# Patient Record
Sex: Male | Born: 1985 | Race: Black or African American | Hispanic: No | Marital: Married | Smoking: Former smoker
Health system: Southern US, Community
[De-identification: ages and names within clinical notes are randomized; demographics above are authoritative.]

## PROBLEM LIST (undated history)

## (undated) ENCOUNTER — Emergency Department (HOSPITAL_BASED_OUTPATIENT_CLINIC_OR_DEPARTMENT_OTHER): Payer: BLUE CROSS/BLUE SHIELD | Source: Home / Self Care

## (undated) DIAGNOSIS — J329 Chronic sinusitis, unspecified: Secondary | ICD-10-CM

## (undated) DIAGNOSIS — K219 Gastro-esophageal reflux disease without esophagitis: Secondary | ICD-10-CM

## (undated) HISTORY — DX: Gastro-esophageal reflux disease without esophagitis: K21.9

---
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
## 2007-09-10 ENCOUNTER — Emergency Department (HOSPITAL_COMMUNITY): Admission: EM | Admit: 2007-09-10 | Discharge: 2007-09-10 | Payer: Self-pay | Admitting: Emergency Medicine

## 2008-02-13 ENCOUNTER — Emergency Department (HOSPITAL_COMMUNITY): Admission: EM | Admit: 2008-02-13 | Discharge: 2008-02-13 | Payer: Self-pay | Admitting: Emergency Medicine

## 2011-05-05 LAB — URINALYSIS, ROUTINE W REFLEX MICROSCOPIC
Bilirubin Urine: NEGATIVE
Glucose, UA: NEGATIVE
Hgb urine dipstick: NEGATIVE
Ketones, ur: NEGATIVE
Nitrite: NEGATIVE
Protein, ur: NEGATIVE
Specific Gravity, Urine: 1.009
Urobilinogen, UA: 0.2
pH: 8

## 2011-08-16 ENCOUNTER — Encounter: Payer: Self-pay | Admitting: *Deleted

## 2011-08-16 ENCOUNTER — Emergency Department (HOSPITAL_COMMUNITY)
Admission: EM | Admit: 2011-08-16 | Discharge: 2011-08-16 | Disposition: A | Discharge status: Home / Self Care | Payer: Self-pay | Attending: Emergency Medicine | Admitting: Emergency Medicine

## 2011-08-16 DIAGNOSIS — N5089 Other specified disorders of the male genital organs: Secondary | ICD-10-CM | POA: Insufficient documentation

## 2011-08-16 DIAGNOSIS — F17200 Nicotine dependence, unspecified, uncomplicated: Secondary | ICD-10-CM | POA: Insufficient documentation

## 2011-08-16 NOTE — ED Notes (Signed)
Pt tying dog at fence and exposed to poison ivy, now has it on his scrotum and thighs

## 2011-08-16 NOTE — ED Provider Notes (Signed)
History     CSN: [id number]  Arrival date & time 08/16/11  6578   First MD Initiated Contact with Patient 08/16/11 [phone]      Chief Complaint  Patient presents with  . Poison Ivy    (Consider location/radiation/quality/duration/timing/severity/associated sxs/prior treatment) HPI Comments: Patient comes in today with swelling of his scrotum.  Swelling has been present for 2 days.  Swelling is bilateral.   No pain associated with the swelling.  He reports that he had this in the past and that his symptoms today are the same as it was then.  Reviewing the past chart shows that he had an ultrasound of his scrotum at that time that showed a 1.3cm cyst in the head of the epididymis.  He denies any fever/chills.  Denies any pain or redness.  Denies any penile discharge.  Reports that he is not having any other symptoms.    The history is provided by the patient.    History reviewed. No pertinent past medical history.  History reviewed. No pertinent past surgical history.  No family history on file.  History  Substance Use Topics  . Smoking status: Current Everyday Smoker  . Smokeless tobacco: Not on file  . Alcohol Use: Yes     occ      Review of Systems  Constitutional: Negative for fever and chills.  Genitourinary: Positive for scrotal swelling. Negative for dysuria, urgency, hematuria, decreased urine volume, discharge, penile swelling, difficulty urinating, genital sores, penile pain and testicular pain.    Allergies  Review of patient's allergies indicates no known allergies.  Home Medications  No current outpatient prescriptions on file.  BP 134/70  Pulse 72  Temp(Src) 98 F (36.7 C) (Oral)  Resp 18  SpO2 100%  Physical Exam  Nursing note and vitals reviewed. Constitutional: He is oriented to person, place, and time. He appears well-developed and well-nourished.  HENT:  Head: Normocephalic and atraumatic.  Neck: Normal range of motion. Neck supple.    Cardiovascular: Normal rate, regular rhythm and normal heart sounds.   Pulmonary/Chest: Effort normal and breath sounds normal. No respiratory distress.  Genitourinary: Right testis shows swelling. Right testis shows no mass and no tenderness. Cremasteric reflex is not absent on the right side. Left testis shows swelling. Left testis shows no mass and no tenderness. Cremasteric reflex is not absent on the left side. Circumcised. No penile erythema or penile tenderness. No discharge found.       Swelling of the scrotum bilaterally.  Swelling is symmetric. No pain during examination of the scrotum and testicles.   No skin lesions or rash present.  Neurological: He is alert and oriented to person, place, and time.  Skin: Skin is warm and dry. No rash noted. No erythema.  Psychiatric: He has a normal mood and affect.    ED Course  Procedures (including critical care time)  Labs Reviewed - No data to display No results found.   1. Scrotum swelling     Discussed patient with Dr. Fonnie Jarvis.  Dr. Fonnie Jarvis does not feel that imaging is needed at this time because the patient is not having any pain and the swelling is symmetric.  MDM  No pain associated with swelling and swelling has been present for 2-3 days.  Normal GU exam aside from the swelling.  Therefore, do not feel that testicular torsion is likely and do not feel that an ultrasound is needed at this time.  Patient instructed to follow up with Urology outpatient.  Pascal Lux [facility] 08/16/11 1652

## 2011-08-20 NOTE — ED Provider Notes (Signed)
Medical screening examination/treatment/procedure(s) were performed by non-physician practitioner and as supervising physician I was immediately available for consultation/collaboration.  Hurman Horn, MD 08/20/11 2249

## 2012-03-07 ENCOUNTER — Emergency Department (HOSPITAL_COMMUNITY)
Admission: EM | Admit: 2012-03-07 | Discharge: 2012-03-07 | Disposition: A | Discharge status: Home / Self Care | Payer: Self-pay | Attending: Emergency Medicine | Admitting: Emergency Medicine

## 2012-03-07 ENCOUNTER — Encounter (HOSPITAL_COMMUNITY): Payer: Self-pay | Admitting: *Deleted

## 2012-03-07 DIAGNOSIS — F17200 Nicotine dependence, unspecified, uncomplicated: Secondary | ICD-10-CM | POA: Insufficient documentation

## 2012-03-07 DIAGNOSIS — H9209 Otalgia, unspecified ear: Secondary | ICD-10-CM | POA: Insufficient documentation

## 2012-03-07 DIAGNOSIS — H9203 Otalgia, bilateral: Secondary | ICD-10-CM

## 2012-03-07 MED ORDER — ANTIPYRINE-BENZOCAINE 5.4-1.4 % OT SOLN
3.0000 [drp] | Freq: Once | OTIC | Status: AC
  Administered 2012-03-07: 4 [drp] via OTIC
  Filled 2012-03-07: qty 10

## 2012-03-07 NOTE — ED Provider Notes (Signed)
History     CSN: [id number]  Arrival date & time 03/07/12  1115   First MD Initiated Contact with Patient 03/07/12 1258      Chief Complaint  Patient presents with  . Otalgia    (Consider location/radiation/quality/duration/timing/severity/associated sxs/prior treatment) HPI  26 year old male presents complaining of ear pain.  Pt report he was getting over a cold a week ago however he continues to have pain and fluid sensation to his ears R>L.  Pain is waxing and waning worse at night, with coughing or swallowing.  Denies fever, chills, ringing in ears, jaw pain, or trouble swallowing.  Denies swimming in pool or pain when laying on affected ear.  Denies discharge, or rash.  History reviewed. No pertinent past medical history.  History reviewed. No pertinent past surgical history.  No family history on file.  History  Substance Use Topics  . Smoking status: Current Everyday Smoker  . Smokeless tobacco: Not on file  . Alcohol Use: Yes     occ      Review of Systems  Constitutional: Negative for fever.  HENT: Positive for ear pain. Negative for hearing loss, tinnitus and ear discharge.   Skin: Negative for rash.  Neurological: Negative for numbness.  All other systems reviewed and are negative.    Allergies  Review of patient's allergies indicates no known allergies.  Home Medications  No current outpatient prescriptions on file.  BP 122/71  Pulse 96  Temp 98.7 F (37.1 C) (Oral)  Resp 18  SpO2 100%  Physical Exam  Nursing note and vitals reviewed. Constitutional: He appears well-developed and well-nourished. No distress.  HENT:  Head: Normocephalic and atraumatic.  Right Ear: Hearing, tympanic membrane and external ear normal. No drainage, swelling or tenderness. No foreign bodies. Tympanic membrane is not injected, not perforated and not retracted. No decreased hearing is noted.  Left Ear: Hearing, tympanic membrane and external ear normal. No drainage,  swelling or tenderness. No foreign bodies. Tympanic membrane is not injected, not perforated and not retracted. No decreased hearing is noted.  Mouth/Throat: Oropharynx is clear and moist. No oropharyngeal exudate.  Eyes: Conjunctivae are normal.  Neck: Normal range of motion. Neck supple.  Lymphadenopathy:    He has no cervical adenopathy.  Neurological: He is alert.  Skin: Skin is warm. No rash noted.  Psychiatric: He has a normal mood and affect.    ED Course  Procedures (including critical care time)  Labs Reviewed - No data to display No results found.   No diagnosis found.  1. otalgia  MDM  Ear pain, no obvious signs of infection, is afebrile.  Will give auralgan ear drops for sxs relief.          Fayrene Helper, PA-C 03/07/12 1341

## 2012-03-07 NOTE — ED Notes (Signed)
Pt reports recent cold, that has progressed to an earache, sts "there is fluid behind my eardrum." Pressure, worse when coughing or swallowing.

## 2012-03-07 NOTE — ED Provider Notes (Signed)
Medical screening examination/treatment/procedure(s) were performed by non-physician practitioner and as supervising physician I was immediately available for consultation/collaboration.   Nat Christen, MD 03/07/12 [phone]

## 2013-11-28 ENCOUNTER — Emergency Department (HOSPITAL_COMMUNITY)
Admission: EM | Admit: 2013-11-28 | Discharge: 2013-11-28 | Disposition: A | Discharge status: Home / Self Care | Payer: Self-pay | Attending: Emergency Medicine | Admitting: Emergency Medicine

## 2013-11-28 ENCOUNTER — Encounter (HOSPITAL_COMMUNITY): Payer: Self-pay | Admitting: Emergency Medicine

## 2013-11-28 DIAGNOSIS — R1032 Left lower quadrant pain: Secondary | ICD-10-CM

## 2013-11-28 DIAGNOSIS — R109 Unspecified abdominal pain: Secondary | ICD-10-CM | POA: Insufficient documentation

## 2013-11-28 DIAGNOSIS — F17200 Nicotine dependence, unspecified, uncomplicated: Secondary | ICD-10-CM | POA: Insufficient documentation

## 2013-11-28 LAB — URINALYSIS, ROUTINE W REFLEX MICROSCOPIC
BILIRUBIN URINE: NEGATIVE
GLUCOSE, UA: NEGATIVE mg/dL
Hgb urine dipstick: NEGATIVE
Ketones, ur: NEGATIVE mg/dL
Leukocytes, UA: NEGATIVE
Nitrite: NEGATIVE
Protein, ur: NEGATIVE mg/dL
SPECIFIC GRAVITY, URINE: 1.018 (ref 1.005–1.030)
UROBILINOGEN UA: 1 mg/dL (ref 0.0–1.0)
pH: 6 (ref 5.0–8.0)

## 2013-11-28 MED ORDER — HYDROCODONE-ACETAMINOPHEN 5-325 MG PO TABS: 2.0000 | ORAL_TABLET | ORAL | Status: DC | PRN

## 2013-11-28 NOTE — Discharge Instructions (Signed)
Hernia A hernia occurs when an internal organ pushes out through a weak spot in the abdominal wall. Hernias most commonly occur in the groin and around the navel. Hernias often can be pushed back into place (reduced). Most hernias tend to get worse over time. Some abdominal hernias can get stuck in the opening (irreducible or incarcerated hernia) and cannot be reduced. An irreducible abdominal hernia which is tightly squeezed into the opening is at risk for impaired blood supply (strangulated hernia). A strangulated hernia is a medical emergency. Because of the risk for an irreducible or strangulated hernia, surgery may be recommended to repair a hernia. CAUSES   Heavy lifting.  Prolonged coughing.  Straining to have a bowel movement.  A cut (incision) made during an abdominal surgery. HOME CARE INSTRUCTIONS   Bed rest is not required. You may continue your normal activities.  Avoid lifting more than 10 pounds (4.5 kg) or straining.  Cough gently. If you are a smoker it is best to stop. Even the best hernia repair can break down with the continual strain of coughing. Even if you do not have your hernia repaired, a cough will continue to aggravate the problem.  Do not wear anything tight over your hernia. Do not try to keep it in with an outside bandage or truss. These can damage abdominal contents if they are trapped within the hernia sac.  Eat a normal diet.  Avoid constipation. Straining over long periods of time will increase hernia size and encourage breakdown of repairs. If you cannot do this with diet alone, stool softeners may be used. SEEK IMMEDIATE MEDICAL CARE IF:   You have a fever.  You develop increasing abdominal pain.  You feel nauseous or vomit.  Your hernia is stuck outside the abdomen, looks discolored, feels hard, or is tender.  You have any changes in your bowel habits or in the hernia that are unusual for you.  You have increased pain or swelling around the  hernia.  You cannot push the hernia back in place by applying gentle pressure while lying down. MAKE SURE YOU:   Understand these instructions.  Will watch your condition.  Will get help right away if you are not doing well or get worse. Document Released: 08/01/2005 Document Revised: 10/24/2011 Document Reviewed: 03/20/2008 Exit[employer] Patient Information 2014 [employer].  

## 2013-11-28 NOTE — ED Provider Notes (Signed)
CSN: [id number]     Arrival date & time 11/28/13  1919 History   First MD Initiated Contact with Patient 11/28/13 1946     Chief Complaint  Patient presents with  . Testicle Pain      HPI Pt reports testicle pain that started 2 days ago that comes and goes. Pt states pain is an achy throbbing pain in quality that radiates to L groin. Pt denies any n/v, fever. Pt alert and oriented and ambulatory to triage area.  History reviewed. No pertinent past medical history. History reviewed. No pertinent past surgical history. History reviewed. No pertinent family history. History  Substance Use Topics  . Smoking status: Current Every Day Smoker    Types: Cigarettes  . Smokeless tobacco: Not on file  . Alcohol Use: Yes     Comment: occ    Review of Systems   All other systems reviewed and are negative   Allergies  Review of patient's allergies indicates no known allergies.  Home Medications   Prior to Admission medications   Medication Sig Start Date End Date Taking? Authorizing Provider  diphenhydrAMINE (BENADRYL) 25 mg capsule Take 25 mg by mouth every 6 (six) hours as needed for allergies.   Yes Historical Provider, MD   BP 122/63  Pulse 68  Temp(Src) 97.9 F (36.6 C) (Oral)  Resp 16  Ht 5\' 7"  (1.702 m)  Wt 243 lb 9.6 oz (110.496 kg)  BMI 38.14 kg/m2  SpO2 99% Physical Exam  Nursing note and vitals reviewed. Constitutional: He is oriented to person, place, and time. He appears well-developed and well-nourished. No distress.  HENT:  Head: Normocephalic and atraumatic.  Eyes: Pupils are equal, round, and reactive to light.  Neck: Normal range of motion.  Cardiovascular: Normal rate and intact distal pulses.   Pulmonary/Chest: No respiratory distress.  Abdominal: Normal appearance. He exhibits no distension. Hernia confirmed negative in the right inguinal area and confirmed negative in the left inguinal area.  Genitourinary: Testes normal and penis normal. Cremasteric  reflex is present. Right testis shows no mass, no swelling and no tenderness. Right testis is descended. Left testis shows no mass, no swelling and no tenderness. Left testis is descended.  Musculoskeletal: Normal range of motion.  Lymphadenopathy:       Right: No inguinal adenopathy present.       Left: No inguinal adenopathy present.  Neurological: He is alert and oriented to person, place, and time. No cranial nerve deficit.  Skin: Skin is warm and dry. No rash noted.  Psychiatric: He has a normal mood and affect. His behavior is normal.    ED Course  Procedures (including critical care time) Labs Review Labs Reviewed  URINALYSIS, ROUTINE W REFLEX MICROSCOPIC    Imaging Review No results found.    MDM   Final diagnoses:  Left inguinal pain        Nelia Shiobert L Rashada Klontz, MD 12/05/13 [phone]

## 2013-11-28 NOTE — ED Notes (Signed)
Pt reports testicle pain that started 2 days ago that comes and goes. Pt states pain is an achy throbbing pain in quality that radiates to L groin. Pt denies any n/v, fever. Pt alert and oriented and ambulatory to triage area.

## 2014-02-13 ENCOUNTER — Encounter (HOSPITAL_COMMUNITY): Payer: Self-pay | Admitting: Emergency Medicine

## 2014-02-13 ENCOUNTER — Emergency Department (HOSPITAL_COMMUNITY)
Admission: EM | Admit: 2014-02-13 | Discharge: 2014-02-13 | Disposition: A | Discharge status: Home / Self Care | Payer: Self-pay | Attending: Emergency Medicine | Admitting: Emergency Medicine

## 2014-02-13 DIAGNOSIS — F17200 Nicotine dependence, unspecified, uncomplicated: Secondary | ICD-10-CM | POA: Insufficient documentation

## 2014-02-13 DIAGNOSIS — H5789 Other specified disorders of eye and adnexa: Secondary | ICD-10-CM | POA: Insufficient documentation

## 2014-02-13 DIAGNOSIS — Z8709 Personal history of other diseases of the respiratory system: Secondary | ICD-10-CM | POA: Insufficient documentation

## 2014-02-13 DIAGNOSIS — H539 Unspecified visual disturbance: Secondary | ICD-10-CM | POA: Insufficient documentation

## 2014-02-13 DIAGNOSIS — H5712 Ocular pain, left eye: Secondary | ICD-10-CM

## 2014-02-13 DIAGNOSIS — Z79899 Other long term (current) drug therapy: Secondary | ICD-10-CM | POA: Insufficient documentation

## 2014-02-13 DIAGNOSIS — H5710 Ocular pain, unspecified eye: Secondary | ICD-10-CM | POA: Insufficient documentation

## 2014-02-13 HISTORY — DX: Chronic sinusitis, unspecified: J32.9

## 2014-02-13 MED ORDER — IBUPROFEN 800 MG PO TABS: 800.0000 mg | ORAL_TABLET | Freq: Three times a day (TID) | ORAL | Status: DC

## 2014-02-13 NOTE — Progress Notes (Signed)
[facility] CL provided pt with a list of primary care resources. Patient stated that he can get insurance through job but has not filled out paperwork. CL encouraged patient to sign up with insurance through job.

## 2014-02-13 NOTE — ED Provider Notes (Signed)
CSN: [id number]     Arrival date & time 02/13/14  1531 History  This chart was scribed for Santiago GladHeather Laurelyn Terrero PA-C working with Harrold DonathNathan R. Rubin PayorPickering, MD by Ashley JacobsBrittany Andrews, ED scribe. This patient was seen in room WTR7/WTR7 and the patient's care was started at 4:21 PM.  First MD Initiated Contact with Patient 02/13/14 1616     Chief Complaint  Patient presents with  . Eye Pain    pain in l/eye x 3 days. Denies trauma     (Consider location/radiation/quality/duration/timing/severity/associated sxs/prior Treatment) Patient is a 28 y.o. male presenting with eye pain. The history is provided by the patient and medical records. No language interpreter was used.  Eye Pain   HPI Comments: Connor White is a 28 y.o. 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 He describes the pain as a pressure.  He reports that his vision has seemed slightly blurred and that he has also noticed slight redness of the eye.  He has not taken anything for his symptoms prior to arrival.  He does not wear contact lenses.  He denies fever or chills.   Past Medical History  Diagnosis Date  . Sinusitis    History reviewed. No pertinent past surgical history. Family History  Problem Relation Age of Onset  . Hypertension Mother   . Diabetes Other    History  Substance Use Topics  . Smoking status: Current Every Day Smoker    Types: Cigarettes  . Smokeless tobacco: Not on file  . Alcohol Use: Yes     Comment: occ    Review of Systems  Eyes: Positive for pain, discharge, redness and visual disturbance.  All other systems reviewed and are negative.     Allergies  Review of patient's allergies indicates no known allergies.  Home Medications   Prior to Admission medications   Medication Sig Start Date End Date Taking? Authorizing Provider  diphenhydrAMINE  (BENADRYL) 25 mg capsule Take 25 mg by mouth every 6 (six) hours as needed for allergies.    Historical Provider, MD  HYDROcodone-acetaminophen (NORCO/VICODIN) 5-325 MG per tablet Take 2 tablets by mouth every 4 (four) hours as needed. 11/28/13   Nelia Shiobert L Beaton, MD   BP 118/70  Pulse 88  Temp(Src) 98.4 F (36.9 C) (Oral)  Resp 16  SpO2 98% Physical Exam  Nursing note and vitals reviewed. Constitutional: He appears well-developed and well-nourished.  HENT:  Head: Normocephalic and atraumatic.  Eyes: Conjunctivae, EOM and lids are normal. Pupils are equal, round, and reactive to light. Lids are everted and swept, no foreign bodies found. Left eye exhibits no chemosis, no discharge, no exudate and no hordeolum. No foreign body present in the left eye. Right conjunctiva is not injected. Left conjunctiva is not injected. No scleral icterus.  Slit lamp exam:      The left eye shows no corneal abrasion, no corneal flare, no corneal ulcer, no foreign body and no fluorescein uptake.  No periorbital erythema or edema.  IOP of the left eye is 21   Visual Acuity  Right Eye Distance: 20/20 Left Eye Distance: 20/20 Bilateral Distance: 20/20  Right Eye Near:   Left Eye Near:    Bilateral Near:     Neck: Normal range of motion. Neck supple.  Cardiovascular: Normal rate, regular rhythm and normal heart sounds.   Pulmonary/Chest: Effort normal and  breath sounds normal.  Neurological: He is alert.  Skin: Skin is warm and dry. No erythema.  Psychiatric: He has a normal mood and affect.    ED Course  Procedures (including critical care time) DIAGNOSTIC STUDIES: Oxygen Saturation is 98% on room air, normal by my interpretation.    COORDINATION OF CARE:  4:26 PM Discussed course of care with pt . Pt understands and agrees.   Labs Review Labs Reviewed - No data to display  Imaging Review No results found.   EKG Interpretation None      MDM   Final diagnoses:  None   Patient  presenting with left eye pain.  Normal visual acuity.  No fluorescein uptake on exam.  IOP of left eye is 21.  No signs of infection on exam.  Therefore, feel that the patient is stable for discharge.  Patient given referral to Ophthalmology if symptoms persist.  Return precautions given.    Santiago GladHeather Jermisha Hoffart, PA-C 02/16/14 [phone]

## 2014-02-13 NOTE — ED Notes (Signed)
L/eye slightly red, Pt reports pain and irritation in l/eye x 3 days

## 2014-02-17 NOTE — ED Provider Notes (Signed)
Medical screening examination/treatment/procedure(s) were performed by non-physician practitioner and as supervising physician I was immediately available for consultation/collaboration.   EKG Interpretation None       Juliet RudeNathan R. Rubin PayorPickering, MD 02/17/14 1257

## 2015-05-07 ENCOUNTER — Emergency Department (HOSPITAL_COMMUNITY)
Admission: EM | Admit: 2015-05-07 | Discharge: 2015-05-07 | Disposition: A | Discharge status: Home / Self Care | Payer: Self-pay | Attending: Physician Assistant | Admitting: Physician Assistant

## 2015-05-07 ENCOUNTER — Emergency Department (HOSPITAL_COMMUNITY): Payer: Self-pay

## 2015-05-07 ENCOUNTER — Encounter (HOSPITAL_COMMUNITY): Payer: Self-pay

## 2015-05-07 DIAGNOSIS — R0602 Shortness of breath: Secondary | ICD-10-CM | POA: Insufficient documentation

## 2015-05-07 DIAGNOSIS — R000 Tachycardia, unspecified: Secondary | ICD-10-CM | POA: Insufficient documentation

## 2015-05-07 DIAGNOSIS — F1410 Cocaine abuse, uncomplicated: Secondary | ICD-10-CM | POA: Insufficient documentation

## 2015-05-07 DIAGNOSIS — R002 Palpitations: Secondary | ICD-10-CM | POA: Insufficient documentation

## 2015-05-07 DIAGNOSIS — Z720 Tobacco use: Secondary | ICD-10-CM | POA: Insufficient documentation

## 2015-05-07 LAB — BASIC METABOLIC PANEL
Anion gap: 11 (ref 5–15)
CALCIUM: 9.1 mg/dL (ref 8.9–10.3)
CO2: 22 mmol/L (ref 22–32)
Chloride: 108 mmol/L (ref 101–111)
Creatinine, Ser: 0.87 mg/dL (ref 0.61–1.24)
GFR calc Af Amer: >60 mL/min (ref >60)
GFR calc non Af Amer: >60 mL/min (ref >60)
GLUCOSE: 104 mg/dL — AB (ref 65–99)
POTASSIUM: 3.3 mmol/L — AB (ref 3.5–5.1)
Sodium: 141 mmol/L (ref 135–145)

## 2015-05-07 LAB — RAPID URINE DRUG SCREEN, HOSP PERFORMED
Amphetamines: NOT DETECTED
Barbiturates: NOT DETECTED
Benzodiazepines: NOT DETECTED
Cocaine: POSITIVE — AB
Opiates: NOT DETECTED
Tetrahydrocannabinol: NOT DETECTED

## 2015-05-07 LAB — CBC WITH DIFFERENTIAL/PLATELET
Basophils Absolute: 0 10*3/uL (ref 0.0–0.1)
Basophils Relative: 0 %
Eosinophils Absolute: 0 10*3/uL (ref 0.0–0.7)
Eosinophils Relative: 0 %
HCT: 42.6 % (ref 39.0–52.0)
Hemoglobin: 15.2 g/dL (ref 13.0–17.0)
LYMPHS ABS: 1.4 10*3/uL (ref 0.7–4.0)
LYMPHS PCT: 18 %
MCH: 32.1 pg (ref 26.0–34.0)
MCHC: 35.7 g/dL (ref 30.0–36.0)
MCV: 90.1 fL (ref 78.0–100.0)
MONO ABS: 0.9 10*3/uL (ref 0.1–1.0)
MONOS PCT: 12 %
Neutro Abs: 5.5 10*3/uL (ref 1.7–7.7)
Neutrophils Relative %: 70 %
Platelets: 273 10*3/uL (ref 150–400)
RBC: 4.73 MIL/uL (ref 4.22–5.81)
RDW: 13.4 % (ref 11.5–15.5)
WBC: 7.8 10*3/uL (ref 4.0–10.5)

## 2015-05-07 LAB — I-STAT TROPONIN, ED: Troponin i, poc: 0.03 ng/mL (ref 0.00–0.08)

## 2015-05-07 LAB — MAGNESIUM: Magnesium: 1.7 mg/dL (ref 1.7–2.4)

## 2015-05-07 MED ORDER — LORAZEPAM 2 MG/ML IJ SOLN
2.0000 mg | Freq: Once | INTRAMUSCULAR | Status: AC
  Administered 2015-05-07: 2 mg via INTRAVENOUS
  Filled 2015-05-07: qty 1

## 2015-05-07 MED ORDER — SODIUM CHLORIDE 0.9 % IV BOLUS (SEPSIS)
1000.0000 mL | Freq: Once | INTRAVENOUS | Status: AC
  Administered 2015-05-07: 1000 mL via INTRAVENOUS

## 2015-05-07 NOTE — ED Provider Notes (Signed)
CSN: [id number]     Arrival date & time 05/07/15  4098 History   First MD Initiated Contact with Patient 05/07/15 [phone]     Chief Complaint  Patient presents with  . Irregular Heart Beat     (Consider location/radiation/quality/duration/timing/severity/associated sxs/prior Treatment) Patient is a 29 y.o. male presenting with palpitations.  Palpitations Palpitations quality:  Fast Onset quality:  Sudden Duration:  2 hours Timing:  Constant Progression:  Improving Chronicity:  New Context: illicit drugs   Relieved by:  None tried Worsened by:  Nothing Ineffective treatments:  None tried Associated symptoms: shortness of breath   Associated symptoms: no chest pain, no nausea, no near-syncope and no vomiting   Risk factors: no heart disease     Past Medical History  Diagnosis Date  . Sinusitis    History reviewed. No pertinent past surgical history. Family History  Problem Relation Age of Onset  . Hypertension Mother   . Diabetes Other    Social History  Substance Use Topics  . Smoking status: Current Every Day Smoker    Types: Cigarettes  . Smokeless tobacco: None  . Alcohol Use: Yes     Comment: occ    Review of Systems  Constitutional: Negative for fever and chills.  HENT: Negative for congestion and sore throat.   Eyes: Negative for visual disturbance.  Respiratory: Positive for shortness of breath. Negative for wheezing.   Cardiovascular: Positive for palpitations. Negative for chest pain and near-syncope.  Gastrointestinal: Negative for nausea, vomiting, abdominal pain, diarrhea and constipation.  Genitourinary: Negative for dysuria and difficulty urinating.  Musculoskeletal: Negative for myalgias and arthralgias.  Skin: Negative for wound.  Neurological: Negative for syncope and headaches.  Psychiatric/Behavioral: Negative for behavioral problems.  All other systems reviewed and are negative.     Allergies  Review of patient's allergies indicates no  known allergies.  Home Medications   Prior to Admission medications   Medication Sig Start Date End Date Taking? Authorizing Provider  ibuprofen (ADVIL,MOTRIN) 200 MG tablet Take 400 mg by mouth every 6 (six) hours as needed for mild pain.   Yes Historical Provider, MD  diphenhydrAMINE (BENADRYL) 25 mg capsule Take 25 mg by mouth every 6 (six) hours as needed for allergies.    Historical Provider, MD  HYDROcodone-acetaminophen (NORCO/VICODIN) 5-325 MG per tablet Take 2 tablets by mouth every 4 (four) hours as needed. Patient not taking: Reported on 05/07/2015 11/28/13   Nelva Nay, MD  ibuprofen (ADVIL,MOTRIN) 800 MG tablet Take 1 tablet (800 mg total) by mouth 3 (three) times daily. Patient not taking: Reported on 05/07/2015 02/13/14   Heather Laisure, PA-C   BP 141/84 mmHg  Pulse 111  Temp(Src) 98.3 F (36.8 C) (Oral)  Resp 16  Ht  (1.702 m)  Wt 240 lb (108.863 kg)  BMI 37.58 kg/m2  SpO2 99% Physical Exam  Constitutional: He is oriented to person, place, and time. He appears well-developed and well-nourished.  HENT:  Head: Normocephalic and atraumatic.  Eyes: EOM are normal.  Neck: Normal range of motion.  Cardiovascular: Regular rhythm and normal heart sounds.  Tachycardia present.   No murmur heard. Pulmonary/Chest: Effort normal and breath sounds normal. No respiratory distress. He has no wheezes. He has no rales.  Abdominal: Soft. There is no tenderness.  Musculoskeletal: He exhibits no edema.  Neurological: He is alert and oriented to person, place, and time.  Skin: No rash noted. He is not diaphoretic.    ED Course  Procedures (including critical care  time) Labs Review Labs Reviewed  URINE RAPID DRUG SCREEN, HOSP PERFORMED - Abnormal; Notable for the following:    Cocaine POSITIVE (*)    All other components within normal limits  BASIC METABOLIC PANEL - Abnormal; Notable for the following:    Potassium 3.3 (*)    Glucose, Bld 104 (*)    BUN <5 (*)    All  other components within normal limits  CBC WITH DIFFERENTIAL/PLATELET  MAGNESIUM  I-STAT TROPOININ, ED    Imaging Review Dg Chest 2 View  05/07/2015   CLINICAL DATA:  Shortness of breath, tachycardia.  EXAM: CHEST  2 VIEW  COMPARISON:  None.  FINDINGS: The heart size and mediastinal contours are within normal limits. Both lungs are clear. No pneumothorax or pleural effusion is noted. The visualized skeletal structures are unremarkable.  IMPRESSION: No active cardiopulmonary disease.   Electronically Signed   By: Lupita Raider, M.D.   On: 05/07/2015 10:10   I have personally reviewed and evaluated these images and lab results as part of my medical decision-making.   EKG Interpretation   Date/Time:  Thursday May 07 2015 08:41:26 EDT Ventricular Rate:  114 PR Interval:  189 QRS Duration: 104 QT Interval:  324 QTC Calculation: 446 R Axis:   83 Text Interpretation:  Sinus tachycardia Probable inferior infarct, old  Borderline ST elevation, anterior leads Lateral leads are also involved  Tachycardic. q waves inferior  ST minimal elevation anterior, likely BER  Confirmed by Corlis Leak, COURTNEY ([id number]) on 05/07/2015 8:52:03 AM      MDM   Final diagnoses:  Cocaine abuse  Palpitations     Patient is a 29 year old male that presents with palpitations and tachycardia. Patient states he used large amount of cocaine and drank 10 beers last night and this morning when he was getting ready for school began feeling short of breath and palpitations. Patient denies chest pain at this time. Arrival to the ED the patient is tachycardic to 120 in sinus rhythm. We will get screening labs, IV fluids, Ativan and reassess.  Following Ativan IV fluids patient states he feels much better at this time. Patient's heart rate is now 90. Patient's labs are reassuring. Patient given resources for addiction recovery.  Beverely Risen, MD 05/07/15 1106  Courteney Randall An, MD 05/07/15 6045

## 2015-05-07 NOTE — ED Notes (Signed)
Resident Lafayette Dragon, MD at the bedside.

## 2015-05-07 NOTE — ED Notes (Signed)
Pt reports drinking aprox. 10 beers last night and using cocaine around 0200 this morning.  Pt woke up to get ready for school and reports that he felt as if his heart was racing.  Pt denies dizziness, chest pain or SOB.  Pt sinus tach on monitor.

## 2015-05-07 NOTE — Discharge Instructions (Signed)
Stimulant Use Disorder-Cocaine °Cocaine is one of a group of powerful drugs called stimulants. Cocaine has medical uses for stopping nosebleeds and for pain control before minor nose or dental surgery. However, cocaine is misused because of the effects that it produces. These effects include:  °· A feeling of extreme pleasure. °· Alertness. °· High energy. °Common street names for cocaine include coke, crack, blow, snow, and nose candy. Cocaine is snorted, dissolved in water and injected, or smoked.  °Stimulants are addictive because they activate regions of the brain that produce both the pleasurable sensation of "reward" and psychological dependence. Together, these actions account for loss of control and the rapid development of drug dependence. This means you become ill without the drug (withdrawal) and need to keep using it to function.  °Stimulant use disorder is use of stimulants that disrupts your daily life. It disrupts relationships with family and friends and how you do your job. Cocaine increases your blood pressure and heart rate. It can cause a heart attack or stroke. Cocaine can also cause death from irregular heart rate or seizures. °SYMPTOMS °Symptoms of stimulant use disorder with cocaine include: °· Use of cocaine in larger amounts or over a longer period of time than intended. °· Unsuccessful attempts to cut down or control cocaine use. °· A lot of time spent obtaining, using, or recovering from the effects of cocaine. °· A strong desire or urge to use cocaine (craving). °· Continued use of cocaine in spite of major problems at work, school, or home because of use. °· Continued use of cocaine in spite of relationship problems because of use. °· Giving up or cutting down on important life activities because of cocaine use. °· Use of cocaine over and over in situations when it is physically hazardous, such as driving a car. °· Continued use of cocaine in spite of a physical problem that is likely  related to use. Physical problems can include: °¨ Malnutrition. °¨ Nosebleeds. °¨ Chest pain. °¨ High blood pressure. °¨ A hole that develops between the part of your nose that separates your nostrils (perforated nasal septum). °¨ Lung and kidney damage. °· Continued use of cocaine in spite of a mental problem that is likely related to use. Mental problems can include: °¨ Schizophrenia-like symptoms. °¨ Depression. °¨ Bipolar mood swings. °¨ Anxiety. °¨ Sleep problems. °· Need to use more and more cocaine to get the same effect, or lessened effect over time with use of the same amount of cocaine (tolerance). °· Having withdrawal symptoms when cocaine use is stopped, or using cocaine to reduce or avoid withdrawal symptoms. Withdrawal symptoms include: °¨ Depressed or irritable mood. °¨ Low energy or restlessness. °¨ Bad dreams. °¨ Poor or excessive sleep. °¨ Increased appetite. °DIAGNOSIS °Stimulant use disorder is diagnosed by your health care provider. You may be asked questions about your cocaine use and how it affects your life. A physical exam may be done. A drug screen may be ordered. You may be referred to a mental health professional. The diagnosis of stimulant use disorder requires at least two symptoms within 12 months. The type of stimulant use disorder depends on the number of signs and symptoms you have. The type may be: °· Mild. Two or three signs and symptoms. °· Moderate. Four or five signs and symptoms. °· Severe. Six or more signs and symptoms. °TREATMENT °Treatment for stimulant use disorder is usually provided by mental health professionals with training in substance use disorders. The following options are available: °·   Counseling or talk therapy. Talk therapy addresses the reasons you use cocaine and ways to keep you from using again. Goals of talk therapy include: °¨ Identifying and avoiding triggers for use. °¨ Handling cravings. °¨ Replacing use with healthy activities. °· Support groups.  Support groups provide emotional support, advice, and guidance. °· Medicine. Certain medicines may decrease cocaine cravings or withdrawal symptoms. °HOME CARE INSTRUCTIONS °· Take medicines only as directed by your health care provider. °· Identify the people and activities that trigger your cocaine use and avoid them. °· Keep all follow-up visits as directed by your health care provider. °SEEK MEDICAL CARE IF: °· Your symptoms get worse or you relapse. °· You are not able to take medicines as directed. °SEEK IMMEDIATE MEDICAL CARE IF: °· You have serious thoughts about hurting yourself or others. °· You have a seizure, chest pain, sudden weakness, or loss of speech or vision. °FOR MORE INFORMATION °· National Institute on Drug Abuse: www.drugabuse.gov °· Substance Abuse and Mental Health Services Administration: www.samhsa.gov °Document Released: 07/29/2000 Document Revised: 12/16/2013 Document Reviewed: 08/14/2013 °ExitCare® Patient Information ©2015 [employer]. This information is not intended to replace advice given to you by your health care provider. Make sure you discuss any questions you have with your health care provider. ° °

## 2015-06-10 ENCOUNTER — Encounter (HOSPITAL_COMMUNITY): Payer: Self-pay | Admitting: Emergency Medicine

## 2015-06-10 ENCOUNTER — Emergency Department (HOSPITAL_COMMUNITY)
Admission: EM | Admit: 2015-06-10 | Discharge: 2015-06-10 | Disposition: A | Discharge status: Home / Self Care | Payer: Self-pay | Attending: Emergency Medicine | Admitting: Emergency Medicine

## 2015-06-10 ENCOUNTER — Emergency Department (HOSPITAL_COMMUNITY): Payer: Self-pay

## 2015-06-10 DIAGNOSIS — Z720 Tobacco use: Secondary | ICD-10-CM | POA: Insufficient documentation

## 2015-06-10 DIAGNOSIS — Z8709 Personal history of other diseases of the respiratory system: Secondary | ICD-10-CM | POA: Insufficient documentation

## 2015-06-10 DIAGNOSIS — H5712 Ocular pain, left eye: Secondary | ICD-10-CM | POA: Insufficient documentation

## 2015-06-10 DIAGNOSIS — H538 Other visual disturbances: Secondary | ICD-10-CM | POA: Insufficient documentation

## 2015-06-10 LAB — I-STAT CHEM 8, ED
BUN: 6 mg/dL (ref 6–20)
Calcium, Ion: 1.13 mmol/L (ref 1.12–1.23)
Chloride: 103 mmol/L (ref 101–111)
Creatinine, Ser: 1 mg/dL (ref 0.61–1.24)
Glucose, Bld: 92 mg/dL (ref 65–99)
HEMATOCRIT: 51 % (ref 39.0–52.0)
HEMOGLOBIN: 17.3 g/dL — AB (ref 13.0–17.0)
POTASSIUM: 4.4 mmol/L (ref 3.5–5.1)
SODIUM: 140 mmol/L (ref 135–145)
TCO2: 27 mmol/L (ref 0–100)

## 2015-06-10 MED ORDER — TETRACAINE HCL 0.5 % OP SOLN
2.0000 [drp] | Freq: Once | OPHTHALMIC | Status: AC
  Administered 2015-06-10: 2 [drp] via OPHTHALMIC
  Filled 2015-06-10: qty 2

## 2015-06-10 MED ORDER — FLUORESCEIN SODIUM 1 MG OP STRP
1.0000 | ORAL_STRIP | Freq: Once | OPHTHALMIC | Status: AC
  Administered 2015-06-10: 1 via OPHTHALMIC
  Filled 2015-06-10: qty 1

## 2015-06-10 MED ORDER — IOHEXOL 300 MG/ML  SOLN
100.0000 mL | Freq: Once | INTRAMUSCULAR | Status: AC | PRN
Start: 2015-06-10 — End: 2015-06-10
  Administered 2015-06-10: 100 mL via INTRAVENOUS

## 2015-06-10 NOTE — ED Provider Notes (Signed)
CSN: [id number]     Arrival date & time 06/10/15  1425 History  By signing my name below, I, Connor White, attest that this documentation has been prepared under the direction and in the presence of [employer], PA-C. Electronically Signed: Placido White, ED Scribe. 06/10/2015. 4:06 PM.   Chief Complaint  Patient presents with  . Blurred Vision   The history is provided by the patient. No language interpreter was used.    HPI Comments: Connor White is a 29 y.o. male who presents to the Emergency Department complaining of worsening pain and blurred vision to his left eye with onset 1 year ago and noticeable symptoms beginning a few days ago. Pt rates his current pain as 10/10, notes worsening pain with movement of the eye and describes his pain as similar to a prior occurrence in 1999 for which he was admitted to [facility] for 2 weeks. He notes during this episode he was dx with sinusitis. Pt notes taking Tylenol for pain management which provides mild relief. Pt notes having also been seen at [facility] for similar symptoms and denies having followed up with the referred eye specialist. Pt denies taking any medications regularly or any other known health conditions. He denies wearing any prescription eyewear. He denies trouble sleeping or any other associated symptoms.   Past Medical History  Diagnosis Date  . Sinusitis    History reviewed. No pertinent past surgical history. Family History  Problem Relation Age of Onset  . Hypertension Mother   . Diabetes Other    Social History  Substance Use Topics  . Smoking status: Current Every Day Smoker -- 0.10 packs/day    Types: Cigarettes  . Smokeless tobacco: None  . Alcohol Use: Yes     Comment: occ    Review of Systems A complete 10 system review of systems was obtained and all systems are negative except as noted in the HPI and PMH.   Allergies  Review of patient's allergies indicates no known  allergies.  Home Medications   Prior to Admission medications   Medication Sig Start Date End Date Taking? Authorizing Provider  diphenhydrAMINE (BENADRYL) 25 mg capsule Take 25 mg by mouth every 6 (six) hours as needed for allergies.    Historical Provider, MD  HYDROcodone-acetaminophen (NORCO/VICODIN) 5-325 MG per tablet Take 2 tablets by mouth every 4 (four) hours as needed. Patient not taking: Reported on 05/07/2015 11/28/13   Nelva Nay, MD  ibuprofen (ADVIL,MOTRIN) 200 MG tablet Take 400 mg by mouth every 6 (six) hours as needed for mild pain.    Historical Provider, MD  ibuprofen (ADVIL,MOTRIN) 800 MG tablet Take 1 tablet (800 mg total) by mouth 3 (three) times daily. Patient not taking: Reported on 05/07/2015 02/13/14   Heather Laisure, PA-C   BP 133/78 mmHg  Pulse 69  Temp(Src) 97.9 F (36.6 C) (Oral)  Resp 16  SpO2 99% Physical Exam  Constitutional: He is oriented to person, place, and time. He appears well-developed and well-nourished. No distress.  HENT:  Head: Normocephalic and atraumatic.  Mouth/Throat: No oropharyngeal exudate.  Eyes: Conjunctivae and EOM are normal. Pupils are equal, round, and reactive to light.  No proptosis, eye lid swelling, injection, discharge, No abnormal uptake on fluorescein stain, left eye intraocular pressure 17 mmHg. Normal anterior chamber exam by slit lamp. Extraocular movement intact but this induces pain. Pupils equal round reactive to light.   Neck: Normal range of motion. No tracheal deviation present.  Cardiovascular:  Normal rate.   Pulmonary/Chest: Effort normal. No stridor. No respiratory distress.  Abdominal: Soft. There is no tenderness.  Musculoskeletal: Normal range of motion.  Neurological: He is alert and oriented to person, place, and time.  Skin: Skin is warm and dry. He is not diaphoretic.  Psychiatric: He has a normal mood and affect. His behavior is normal.  Nursing note and vitals reviewed.  ED Course  Procedures   DIAGNOSTIC STUDIES: Oxygen Saturation is 99% on RA, normal by my interpretation.    COORDINATION OF CARE: 4:00 PM Pt presents today due to worsening left eye pain. Discussed next steps with pt at bedside. Pt agreed to plan.  Labs Review Labs Reviewed - No data to display  Imaging Review No results found. I have personally reviewed and evaluated these images and lab results as part of my medical decision-making.   EKG Interpretation None      MDM   Final diagnoses:  Eye pain, left  Blurred vision, left eye    Filed Vitals:   06/10/15 1457 06/10/15 1909 06/10/15 1912  BP: 133/78  131/78  Pulse: 69 59   Temp: 97.9 F (36.6 C) 97.9 F (36.6 C)   TempSrc: Oral Oral   Resp: 16 18   SpO2: 99% 100%     Medications  fluorescein ophthalmic strip 1 strip (1 strip Left Eye Given 06/10/15 1619)  tetracaine (PONTOCAINE) 0.5 % ophthalmic solution 2 drop (2 drops Left Eye Given 06/10/15 1619)  iohexol (OMNIPAQUE) 300 MG/ML solution 100 mL (100 mLs Intravenous Contrast Given 06/10/15 1820)    Connor White is 29 y.o. male presenting with 10 out of 10 pain to left eye was reported blurred vision however, vision is 20/30 in this side. I have no abnormalities on physical exam, normal intraocular pressure, normal fluorescein stain, normal slit lamp exam. Patient states he was hospitalized for several weeks in the late 90s with a similar infection. States he's not sure what the type of infection was. His pain is worse with eye movement. In concern for a possible orbital cellulitis, will CT.  [facility] which showed patient did have an orbital cellulitis in the past.  CT negative. Ophthalmology referral given.  Discussed case with attending physician who agrees with care plan and disposition.   Evaluation does not show pathology that would require ongoing emergent intervention or inpatient treatment. Pt is hemodynamically stable and mentating  appropriately. Discussed findings and plan with patient/guardian, who agrees with care plan. All questions answered. Return precautions discussed and outpatient follow up given.   I personally performed the services described in this documentation, which was scribed in my presence. The recorded information has been reviewed and is accurate.    Wynetta Emeryicole Lorelle Macaluso, PA-C 06/11/15 1430  Lavera Guiseana Duo Liu, MD 06/11/15 [phone]

## 2015-06-10 NOTE — Discharge Instructions (Signed)
Do not hesitate to return to the emergency room for any new, worsening or concerning symptoms. ° °Please obtain primary care using resource guide below. Let them know that you were seen in the emergency room and that they will need to obtain records for further outpatient management. ° ° ° °Emergency Department Resource Guide °1) Find a Doctor and Pay Out of Pocket °Although you won't have to find out who is covered by your insurance plan, it is a good idea to ask around and get recommendations. You will then need to call the office and see if the doctor you have chosen will accept you as a new patient and what types of options they offer for patients who are self-pay. Some doctors offer discounts or will set up payment plans for their patients who do not have insurance, but you will need to ask so you aren't surprised when you get to your appointment. ° °2) Contact Your Local Health Department °Not all health departments have doctors that can see patients for sick visits, but many do, so it is worth a call to see if yours does. If you don't know where your local health department is, you can check in your phone book. The CDC also has a tool to help you locate your state's health department, and many state websites also have listings of all of their local health departments. ° °3) Find a Walk-in Clinic °If your illness is not likely to be very severe or complicated, you may want to try a walk in clinic. These are popping up all over the country in pharmacies, drugstores, and shopping centers. They're usually staffed by nurse practitioners or physician assistants that have been trained to treat common illnesses and complaints. They're usually fairly quick and inexpensive. However, if you have serious medical issues or chronic medical problems, these are probably not your best option. ° °No Primary Care Doctor: °- Call Health Connect at  [phone] - they can help you locate a primary care doctor that  accepts your  insurance, provides certain services, etc. °- Physician Referral Service- 1-800-533-3463 ° °Chronic Pain Problems: °Organization         Address  Phone   Notes  °[facility]  (336) 297-2271 Patients need to be referred by their primary care doctor.  ° °Medication Assistance: °Organization         Address  Phone   Notes  °[place] [facility] [address] °[place], [place] [address] (336) 641-8030 --Must be a resident of [place] °-- Must have NO insurance coverage whatsoever (no Medicaid/ Medicare, etc.) °-- The pt. MUST have a primary care doctor that directs their care regularly and follows them in the community °  °[facility]  (866) 331-1348   °[employer]  (888) 892-1162   ° °Agencies that provide inexpensive medical care: °Organization         Address  Phone   Notes  °[facility]  (336) 832-8035   °[facility]    (336) 832-7272   °[facility] [address] °[place], [place] [address] (336) 832-4777   °[facility] [address], °[place] (336) 271-4999   °[facility]    (336) 373-0678   °[facility]    (336) 272-1050   °[facility] ° [address],  Phone:  (336) 832-4444, Fax:  (336) 832-4440 Hours of Operation:  9 am -   6 pm, M-F.  Also accepts Medicaid/Medicare and self-pay.  °[facility] ° [address], [place] Phone: (336) 832-3150, Fax: (336) [phone]. Hours of Operation:  8:30 am - 5:30 pm, M-F.  Also accepts Medicaid and self-pay.  °[facility] [place] [address], [place] Phone: (336) 878-6027   °[facility] [address], [place], [place] (336)723-1848, Ext. 123 Mondays & Thursdays: 7-9 AM.  First 15 patients are seen on a first come, first serve basis. °  ° °Medicaid-accepting [place] Providers: ° °Organization          Address  Phone   Notes  °[facility] [address], [place] (336) 641-2100 Also accepts self-pay patients.  °[facility] [address], [place] ° (336) 856-9996   °[facility] [address], [place] (336) 288-8857   °[facility] [address] [place] [address], [place] (336) 299-7000   °Veita Bland [address], [place]  ° (336) 373-1557 Only accepts [place] Access Medicaid patients after they have their name applied to their card.  ° °Self-Pay (no insurance) in [place]: ° °Organization         Address  Phone   Notes  °Sickle Cell Patients, [facility] [address], [place] (336) 832-1970   °[facility] [address], [place] (336) 832-4400   °[facility] [place] ° [address], [place] (336) 992-4800   °[facility]/Dr. Osei-Bonsu ° [address] [place] [address], [place] or [address], [place] (336) [phone] Phone number for both [place] and [place] locations is the same.  °[facility] [address], [place] (336) 299-0000   °[facility] [address] [place] [address], [place] or [address] (336) 852-7530 °(336) 878-2260   °[facility] [address], [place] (336) 350-1642, phone; (336) 294-5005, fax Sees patients 1st and 3rd Saturday of every month.  Must not qualify for public or private insurance (i.e. Medicaid, Medicare, [place] Health Choice, Veterans' Benefits) • Household income should be no more than 200% of the poverty level •The clinic cannot treat you if you are pregnant or think you are pregnant • Sexually transmitted diseases are not treated at the clinic.  ° ° °Dental Care: °Organization         Address  Phone  Notes  °[facility] [address],  [place] (336) 641-6152 Accepts children up to age 21 who are enrolled in Medicaid or [place] Health Choice; pregnant women with a Medicaid card; and children who have applied for Medicaid or [place] Health Choice, but were declined, whose parents can pay a reduced fee at time of service.  °[facility] [place]  [address], [place] (336) 641-7733 Accepts children up to age 21 who are enrolled in Medicaid or [place] Health Choice; pregnant women with a Medicaid card; and children who have applied for Medicaid or [place] Health Choice, but were declined, whose parents can pay a reduced fee at time of service.  °[facility] ° [address], [place] (336) 641-4533 Patients are seen by appointment only. Walk-ins are not accepted. [facility] will see patients 18 years of age and   older. °Monday - Tuesday (8am-5pm) °Most Wednesdays (8:30-5pm) °$30 per visit, cash only  °[facility] ° [address], [place] (336) 641-4533 Patients are seen by appointment only. Walk-ins are not accepted. [facility] will see patients 18 years of age and older. °One Wednesday Evening (Monthly: Volunteer Based).  $30 per visit, cash only  °[facility]  (919) 537-3737 for adults; Children under age 4, call Graduate Pediatric Dentistry at (919) [phone]. Children aged 4-14, please call (919) [phone] to request a pediatric application. ° Dental services are provided in all areas of dental care including fillings, crowns and bridges, complete and partial dentures, implants, gum treatment, root canals, and extractions. Preventive care is also provided. Treatment is provided to both adults and children. °Patients are selected via a lottery and there is often a waiting list. °  °[facility] [address], °[place] ° (336) 763-8833 [email or web address] °  °[facility] [address], [place], [place]  (336)723-1848, Ext. 123 Second and Fourth Thursday of each month, opens at 6:30 AM; Clinic ends at 9 AM.  Patients are seen on a first-come first-served basis, and a limited number are seen during each clinic.  ° °[facility] ° [address], [place], [place] (336) 723-7904   Eligibility Requirements °You must have lived in [place], or [place] counties for at least the last three months. °  You cannot be eligible for state or federal sponsored healthcare insurance, including Veterans Administration, Medicaid, or Medicare. °  You generally cannot be eligible for healthcare insurance through your employer.  °  How to apply: °Eligibility screenings are held every Tuesday and Wednesday afternoon from 1:00 pm until 4:00 pm. You do not need an appointment for the interview!  °[facility] [address], [place], [place] 336-631-2330   °[facility]  336-342-8273   °[facility]  336-703-3100   °[facility]  336-570-6415   ° °Behavioral Health Resources in the Community: °Intensive Outpatient Programs °Organization         Address  Phone  Notes  °[facility] [address], [place], [place] 336-878-6098   °[facility] [address], [place], [place] 336-832-9800   °[facility] [address], [place], [place] ° 336-882-2125   °[facility] [address],  °[place], [place] 1-800-853-5163 or 336-641-4981   °Substance Abuse Resources °Organization         Address  Phone  Notes  °[facility]  336-882-2125   °[facility]  336-784-9470   °[facility]  336-285-9073   °[facility]  336-845-3988   °Residential & Outpatient Substance Abuse Program  1-800-659-3381   °Psychological Services °Organization         Address  Phone  Notes  °[facility]  336- 832-9600   °[facility]  336- 378-7881    °[facility] [address], [place] 1-800-853-5163 or 336-641-4981   ° °Mobile Crisis Teams °Organization         Address  Phone  Notes  °[facility], Mobile Crisis Care Unit  1-877-626-1772   °Assertive °[facility] ° [address] [place], [place] 336-834-9664   °Sharon DeEsch [address] °[place] 336-554-5454   ° °Self-Help/Support Groups °Organization         Address    Phone             Notes  °[facility] - variety of support groups  336- 373-1402 Call for more information  °Narcotics Anonymous (NA), [facility] [address], °[place]  2 meetings at this location  ° °Residential Treatment Programs °Organization         Address  Phone  Notes  °[facility] [address],    °[place]  1-866-801-8205   °[facility] ° [address], [place], [place] 704-293-8524   °[facility] [address], [place] 336-845-3988 Admissions: 8am-3pm M-F  °[facility] [address]    °[place], [place] 336-841-1104   °[facility] [address], [place], [place] 336-379-7146   °[facility] [address]  °[place], [place] 336-285-9073   °[facility] - Intensive Outpatient [address], [place], [place] 336-852-3033   °[facility] [address]  °[place], [place] 1-877-615-2722 or 336-784-9470   °[facility] ([facility] [address] [place], [place] 336-227-7417 Accepts Medicaid  °[facility] [address]  °[place] 1-800-659-3381 Substance Abuse/Addiction Treatment  ° °[place] Behavioral Health Resources °Organization         Address  Phone  Notes  °[facility]  (888) 581-9988   °Julie Brannon, PhD [address] [place], [place]   (336) 349-5553 or (336) 951-0000   °[facility]   [address] °[place], [place] (336) 349-4454   °[facility] [address], [place], [place] (336) 342-8316 Insurance/Medicaid/sponsorship through [facility]  °[facility] [address]                                    [place], [place] (336) 342-8316 Therapy/tele-psych/case  °[facility] [address]  ° [place], [place] (336) 349-2233    °Dr. Arfeen  (336) 349-4544   °[facility] [place]  [employer] [place] [facility] 1) [address],  °2) [address], [place] °3)  [address], [place] (336) 349-3220 °(336) 342-7768 ° °(336) 342-8140   °[place] Child Abuse Hotline (336) 342-1394 or (336) 342-3537 (After Hours)    ° ° ° °

## 2015-06-10 NOTE — ED Notes (Addendum)
Pt states that his left eye has blurred vision. States symptoms have been progressing for 1 year and unsure of the cause. Has taken tylenol.

## 2015-06-15 ENCOUNTER — Encounter (HOSPITAL_COMMUNITY): Payer: Self-pay | Admitting: *Deleted

## 2015-06-15 ENCOUNTER — Emergency Department (HOSPITAL_COMMUNITY)
Admission: EM | Admit: 2015-06-15 | Discharge: 2015-06-15 | Disposition: A | Discharge status: Home / Self Care | Payer: Self-pay | Attending: Emergency Medicine | Admitting: Emergency Medicine

## 2015-06-15 DIAGNOSIS — F17210 Nicotine dependence, cigarettes, uncomplicated: Secondary | ICD-10-CM | POA: Insufficient documentation

## 2015-06-15 DIAGNOSIS — H5712 Ocular pain, left eye: Secondary | ICD-10-CM | POA: Insufficient documentation

## 2015-06-15 MED ORDER — TETRACAINE HCL 0.5 % OP SOLN
2.0000 [drp] | Freq: Once | OPHTHALMIC | Status: AC
  Administered 2015-06-15: 2 [drp] via OPHTHALMIC
  Filled 2015-06-15: qty 2

## 2015-06-15 MED ORDER — FLUORESCEIN SODIUM 1 MG OP STRP
1.0000 | ORAL_STRIP | Freq: Once | OPHTHALMIC | Status: AC
  Administered 2015-06-15: 1 via OPHTHALMIC
  Filled 2015-06-15: qty 1

## 2015-06-15 NOTE — ED Provider Notes (Signed)
CSN: [id number]     Arrival date & time 06/15/15  1133 History   First MD [id number] Contact with Patient 06/15/15 1212     Chief Complaint  Patient presents with  . Facial Pain  . Eye Problem    HPI   Connor White is a 29 yo M PMH sinusitis, orbital cellulitis pw progressive vision loss since he was seen in the ED 06/10/15. He states he has had blurred vision for almost a year, but his pain began about 2 weeks ago. He describes his pain as constant, 8/10 pain scale, sharp, non-radiating. His pain worsens when he looks to the right. Tylenol provides moderate relief for his pain. On 10/26, he was worked up for similar complaint, and he had normal physical exam with normal intraocular pressure, fluorescein stain, slit lamp exam. Vision was 20/30 in his left eye, which was new for him (baseline 20/20). He had a CT scan of his orbits due to history of orbital cellul[facility] in [facility]. He was discharged with an opthalmology appointment Wednesday, 11/2, along with instructions to return if his vision worsens, so he presented today.   Past Medical History  Diagnosis Date  . Sinusitis    History reviewed. No pertinent past surgical history. Family History  Problem Relation Age of Onset  . Hypertension Mother   . Diabetes Other    Social History  Substance Use Topics  . Smoking status: Current Every Day Smoker -- 0.10 packs/day    Types: Cigarettes  . Smokeless tobacco: None  . Alcohol Use: Yes     Comment: occ    Review of Systems  Constitutional: Negative.   HENT: Negative.   Eyes: Positive for photophobia, pain and visual disturbance. Negative for discharge, redness and itching.       Left eye pain with photophobia and visual disturbances.   Respiratory: Negative.   Cardiovascular: Negative.   Gastrointestinal: Negative.   Endocrine: Negative.   Genitourinary: Negative.   Musculoskeletal: Negative.   Skin: Negative.   Allergic/Immunologic: Negative.    Neurological: Negative.   Hematological: Negative.   Psychiatric/Behavioral: Negative.       Allergies  Review of patient's allergies indicates no known allergies.  Home Medications   Prior to Admission medications   Medication Sig Start Date End Date Taking? Authorizing Provider  diphenhydrAMINE (BENADRYL) 25 mg capsule Take 25 mg by mouth every 6 (six) hours as needed for allergies.    Historical Provider, MD  HYDROcodone-acetaminophen (NORCO/VICODIN) 5-325 MG per tablet Take 2 tablets by mouth every 4 (four) hours as needed. Patient not taking: Reported on 05/07/2015 11/28/13   Nelva Nayobert Beaton, MD  ibuprofen (ADVIL,MOTRIN) 200 MG tablet Take 400 mg by mouth every 6 (six) hours as needed for mild pain.    Historical Provider, MD  ibuprofen (ADVIL,MOTRIN) 800 MG tablet Take 1 tablet (800 mg total) by mouth 3 (three) times daily. Patient not taking: Reported on 05/07/2015 02/13/14   Santiago GladHeather Laisure, PA-C   BP 125/76 mmHg  Pulse 62  Temp(Src) 98.5 F (36.9 C) (Oral)  Resp 16  Ht 5' 7.5" (1.715 m)  Wt 230 lb (104.327 kg)  BMI 35.47 kg/m2  SpO2 100% Physical Exam  Constitutional: He appears well-developed and well-nourished. No distress.  HENT:  Head: Normocephalic and atraumatic.  Right Ear: External ear normal.  Left Ear: External ear normal.  Mouth/Throat: Oropharynx is clear and moist. No oropharyngeal exudate.  Eyes: Conjunctivae are normal. Pupils are equal, round, and reactive to  light. Right eye exhibits no discharge. Left eye exhibits no discharge. No scleral icterus.  No proptosis, eye lid swelling, injection, discharge, No abnormal uptake on fluorescein stain, left eye intraocular pressure 20 mmHg. Normal anterior chamber exam by slit lamp. Extraocular movement intact but this induces pain in left eye when looking right. Pupils equal round reactive to light.   Neck: Normal range of motion. No tracheal deviation present.  Cardiovascular: Normal rate, regular rhythm, normal  heart sounds and intact distal pulses.  Exam reveals no gallop and no friction rub.   No murmur heard. Pulmonary/Chest: Effort normal and breath sounds normal. No respiratory distress. He has no wheezes. He has no rales. He exhibits no tenderness.  Abdominal: Soft. Bowel sounds are normal. He exhibits no distension and no mass. There is no tenderness. There is no rebound and no guarding.  Musculoskeletal: Normal range of motion. He exhibits no edema.  Lymphadenopathy:    He has no cervical adenopathy.  Neurological: He is alert. Coordination normal.  Skin: Skin is warm and dry. No rash noted. He is not diaphoretic. No erythema.  Psychiatric: He has a normal mood and affect. His behavior is normal.  Nursing note and vitals reviewed.   ED Course  Procedures   ED [place] Ocular Exam, performed by [facility], PA-C.  INTERPRETATION: No retinal detachment, Lens in proper position  MDM   Final diagnoses:  Eye pain, left   Connor White has an appointment with an ophthalmologist on Wednesday. Although patient is presenting with worsening visual acuity, other workup in the ED today was unremarkable, including [place] of left orbit. Advised patient to keep appointment with ophthalmologist, and to call and see if they can see him any sooner. He is understanding and in agreement with the plan.    Melton Krebs, PA-C 06/16/15 2358  Eber Hong, MD 06/17/15 [phone]

## 2015-06-15 NOTE — ED Notes (Signed)
PT has an appt with Eye MD at end of week but can not wait because of pain.

## 2015-06-15 NOTE — ED Provider Notes (Signed)
EMERGENCY DEPARTMENT US OCULAR EXAM "Study: Limited Ultrasound of Orbit "  INDICATIONS: Vision loss  Linear probe utilized to obtain images in both long and short axis of the orbit having the patient look left and right if possible.  PERFORMED BY: Myself  IMAGES ARCHIVED?: Yes  LIMITATIONS: none  VIEWS USED: Left orbit  INTERPRETATION: No retinal detachment, Lens in proper position   Dierdre ForthHannah Darielys Giglia, PA-C 06/15/15 1424  Eber HongBrian Miller, MD 06/17/15 [phone]

## 2015-06-15 NOTE — ED Notes (Signed)
PT continues to stated " I can not seen out of my LT eye but they checked my eye and pressure last time. I know it is my sinus . I don't want to go to the eye doctor and then be sent to the ear doctor ."

## 2015-06-15 NOTE — Discharge Instructions (Signed)
Mr. Tyrone NineSmutherman,  Nice meeting you. Please follow-up with the opthalmologist Wednesday, 11/2. Feel better soon.   Ortencia KickS. Nicole Laurin Morgenstern, PA-C

## 2015-06-15 NOTE — ED Notes (Addendum)
PT returns for a recheck of complaints of Lt eye pain . Pt was seen on 05-07-15 and 06-10-15 for same problem. Pt now staes" His vision goes blind and if he does not take tylenol it feels like my eye ball is going to blow up."

## 2016-09-15 ENCOUNTER — Emergency Department (HOSPITAL_COMMUNITY)
Admission: EM | Admit: 2016-09-15 | Discharge: 2016-09-15 | Disposition: A | Discharge status: Home / Self Care | Payer: BLUE CROSS/BLUE SHIELD | Attending: Emergency Medicine | Admitting: Emergency Medicine

## 2016-09-15 ENCOUNTER — Encounter (HOSPITAL_COMMUNITY): Payer: Self-pay

## 2016-09-15 DIAGNOSIS — Y9389 Activity, other specified: Secondary | ICD-10-CM | POA: Diagnosis not present

## 2016-09-15 DIAGNOSIS — Z23 Encounter for immunization: Secondary | ICD-10-CM | POA: Diagnosis not present

## 2016-09-15 DIAGNOSIS — Z7982 Long term (current) use of aspirin: Secondary | ICD-10-CM | POA: Insufficient documentation

## 2016-09-15 DIAGNOSIS — Y92410 Unspecified street and highway as the place of occurrence of the external cause: Secondary | ICD-10-CM | POA: Insufficient documentation

## 2016-09-15 DIAGNOSIS — S70312A Abrasion, left thigh, initial encounter: Secondary | ICD-10-CM | POA: Diagnosis not present

## 2016-09-15 DIAGNOSIS — Y999 Unspecified external cause status: Secondary | ICD-10-CM | POA: Insufficient documentation

## 2016-09-15 DIAGNOSIS — T148XXA Other injury of unspecified body region, initial encounter: Secondary | ICD-10-CM

## 2016-09-15 DIAGNOSIS — S79922A Unspecified injury of left thigh, initial encounter: Secondary | ICD-10-CM | POA: Diagnosis present

## 2016-09-15 DIAGNOSIS — F17210 Nicotine dependence, cigarettes, uncomplicated: Secondary | ICD-10-CM | POA: Diagnosis not present

## 2016-09-15 MED ORDER — HYDROCODONE-ACETAMINOPHEN 5-325 MG PO TABS: ORAL_TABLET | ORAL | 0 refills | Status: DC

## 2016-09-15 MED ORDER — TETANUS-DIPHTH-ACELL PERTUSSIS 5-2.5-18.5 LF-MCG/0.5 IM SUSP
0.5000 mL | Freq: Once | INTRAMUSCULAR | Status: AC
  Administered 2016-09-15: 0.5 mL via INTRAMUSCULAR
  Filled 2016-09-15: qty 0.5

## 2016-09-15 MED ORDER — HYDROCODONE-ACETAMINOPHEN 5-325 MG PO TABS
1.0000 | ORAL_TABLET | Freq: Once | ORAL | Status: AC
  Administered 2016-09-15: 1 via ORAL
  Filled 2016-09-15: qty 1
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
## 2016-09-15 NOTE — Discharge Instructions (Signed)
Rest, Ice intermittently (in the first 24-48 hours), Gentle compression with an Ace wrap, and elevate (Limb above the level of the heart) °  °Take up to 800mg of ibuprofen (that is usually 4 over the counter pills)  3 times a day for 5 days. Take with food. ° °Take vicodin for breakthrough pain, do not drink alcohol, drive, care for children or do other critical tasks while taking vicodin. ° °Do not hesitate to return to the emergency room for any new, worsening or concerning symptoms. ° °Please obtain primary care using resource guide below. Let them know that you were seen in the emergency room and that they will need to obtain records for further outpatient management. ° ° ° °
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
## 2016-09-15 NOTE — ED Notes (Signed)
EDP at bedside  

## 2016-09-15 NOTE — ED Notes (Signed)
Pt. Girlfriend wants patient to head CT because he has a headache. Pt. Wouldn't get off phone for EDP to talk with him.

## 2016-09-16 ENCOUNTER — Emergency Department (HOSPITAL_COMMUNITY): Payer: BLUE CROSS/BLUE SHIELD

## 2016-09-16 ENCOUNTER — Emergency Department (HOSPITAL_COMMUNITY)
Admission: EM | Admit: 2016-09-16 | Discharge: 2016-09-16 | Disposition: A | Discharge status: Home / Self Care | Payer: BLUE CROSS/BLUE SHIELD | Attending: Emergency Medicine | Admitting: Emergency Medicine

## 2016-09-16 ENCOUNTER — Encounter (HOSPITAL_COMMUNITY): Payer: Self-pay

## 2016-09-16 DIAGNOSIS — F17210 Nicotine dependence, cigarettes, uncomplicated: Secondary | ICD-10-CM | POA: Diagnosis not present

## 2016-09-16 DIAGNOSIS — Y939 Activity, unspecified: Secondary | ICD-10-CM | POA: Insufficient documentation

## 2016-09-16 DIAGNOSIS — Y999 Unspecified external cause status: Secondary | ICD-10-CM | POA: Insufficient documentation

## 2016-09-16 DIAGNOSIS — R0789 Other chest pain: Secondary | ICD-10-CM | POA: Diagnosis not present

## 2016-09-16 DIAGNOSIS — S0990XA Unspecified injury of head, initial encounter: Secondary | ICD-10-CM | POA: Diagnosis present

## 2016-09-16 DIAGNOSIS — Y92410 Unspecified street and highway as the place of occurrence of the external cause: Secondary | ICD-10-CM | POA: Insufficient documentation

## 2016-09-16 DIAGNOSIS — R51 Headache: Secondary | ICD-10-CM

## 2016-09-16 DIAGNOSIS — Z7982 Long term (current) use of aspirin: Secondary | ICD-10-CM | POA: Diagnosis not present

## 2016-09-16 DIAGNOSIS — S060X0A Concussion without loss of consciousness, initial encounter: Secondary | ICD-10-CM | POA: Diagnosis not present

## 2016-09-16 DIAGNOSIS — R519 Headache, unspecified: Secondary | ICD-10-CM

## 2016-09-16 LAB — I-STAT TROPONIN, ED: TROPONIN I, POC: 0 ng/mL (ref 0.00–0.08)

## 2016-09-16 MED ORDER — KETOROLAC TROMETHAMINE 30 MG/ML IJ SOLN
30.0000 mg | Freq: Once | INTRAMUSCULAR | Status: AC
  Administered 2016-09-16: 30 mg via INTRAMUSCULAR
  Filled 2016-09-16: qty 1

## 2016-09-16 NOTE — Discharge Instructions (Signed)
Your chest x-ray was normal. No signs of heart contusion. No bleeding on your head CT. We have discussed your head CT findings. I have given you a referral to neurology. They should call to set up an appointment. He also need to follow up with her primary care doctor. Have given you a financial resource guide to find primary care doctors in the area. Get plenty of mental and physical rest. Follow-up with neurology or PCP before returning to sports or strenuous activity. Tylenol and Motrin for pain. Do not take any extra or Motrin or ibuprofen today. May use the pain medicine you were prescribed. We'll give you a prescription for some muscle relaxer. Return to the ED if he develops any double vision, worsening headache, vomiting, difficulty speaking, difficulty walking or for any other reason.
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
## 2016-09-16 NOTE — ED Notes (Signed)
PT DISCHARGED. INSTRUCTIONS GIVEN. AAOX4. PT IN NO APPARENT DISTRESS. THE OPPORTUNITY TO ASK QUESTIONS WAS PROVIDED. 

## 2016-09-16 NOTE — ED Notes (Signed)
Pt ambulated to XR. Will get blood sample upon pt return
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
## 2021-06-14 ENCOUNTER — Encounter (HOSPITAL_BASED_OUTPATIENT_CLINIC_OR_DEPARTMENT_OTHER): Payer: Self-pay | Admitting: *Deleted

## 2021-06-14 ENCOUNTER — Emergency Department (HOSPITAL_BASED_OUTPATIENT_CLINIC_OR_DEPARTMENT_OTHER)
Admission: EM | Admit: 2021-06-14 | Discharge: 2021-06-14 | Disposition: A | Discharge status: Home / Self Care | Payer: BC Managed Care – PPO | Attending: Emergency Medicine | Admitting: Emergency Medicine

## 2021-06-14 ENCOUNTER — Other Ambulatory Visit: Payer: Self-pay

## 2021-06-14 DIAGNOSIS — Z7982 Long term (current) use of aspirin: Secondary | ICD-10-CM | POA: Diagnosis not present

## 2021-06-14 DIAGNOSIS — R519 Headache, unspecified: Secondary | ICD-10-CM | POA: Insufficient documentation

## 2021-06-14 DIAGNOSIS — J101 Influenza due to other identified influenza virus with other respiratory manifestations: Secondary | ICD-10-CM | POA: Insufficient documentation

## 2021-06-14 DIAGNOSIS — Z20822 Contact with and (suspected) exposure to covid-19: Secondary | ICD-10-CM | POA: Insufficient documentation

## 2021-06-14 DIAGNOSIS — F17210 Nicotine dependence, cigarettes, uncomplicated: Secondary | ICD-10-CM | POA: Diagnosis not present

## 2021-06-14 DIAGNOSIS — R509 Fever, unspecified: Secondary | ICD-10-CM | POA: Diagnosis not present

## 2021-06-14 DIAGNOSIS — J111 Influenza due to unidentified influenza virus with other respiratory manifestations: Secondary | ICD-10-CM

## 2021-06-14 LAB — RESP PANEL BY RT-PCR (FLU A&B, COVID) ARPGX2
Influenza A by PCR: POSITIVE — AB
Influenza B by PCR: NEGATIVE
SARS Coronavirus 2 by RT PCR: NEGATIVE

## 2021-06-14 MED ORDER — ACETAMINOPHEN 500 MG PO TABS
1000.0000 mg | ORAL_TABLET | Freq: Once | ORAL | Status: AC
  Administered 2021-06-14: 1000 mg via ORAL
  Filled 2021-06-14: qty 2

## 2021-06-14 NOTE — ED Triage Notes (Signed)
Developed fever and head pressure around 0200 with blurred vision due to headache, reports fever 101 oral at home. Denies N/V/D

## 2021-06-14 NOTE — Discharge Instructions (Signed)
Your symptoms are consistent with COVID, influenza, or other types of viral illness.  Be sure to take Tylenol and/or ibuprofen for pain and fever.  Drink plenty of fluids.  If you develop fever that does not go away, severe headache or neck stiffness, vomiting, coughing up blood, trouble breathing, or any other new/concerning symptoms then return to the ER for evaluation.

## 2021-06-14 NOTE — ED Provider Notes (Signed)
[facility] EMERGENCY DEPT Provider Note   CSN: [id number] Arrival date & time: 06/14/21  6789     History Chief Complaint  Patient presents with   Fever   Headache    Connor White is a 35 y.o. male.  HPI 35 year old male presents with fever and headache.  Started this morning around 2 AM.  His wife was in this emergency department last night for similar symptoms.  He states that in addition to a frontal headache and some eye discomfort, he is having a little bit of nasal congestion, mild cough, mild dyspnea, and diffuse body aches and fatigue.  Took some salve for his headache but no medicines.  He had a fever this morning of 101.  No neck stiffness, vomiting.  When asked about the blurry vision he states he can see but is more that is vision is uncomfortable because of his headache.  Past Medical History:  Diagnosis Date   Sinusitis     There are no problems to display for this patient.   History reviewed. No pertinent surgical history.     Family History  Problem Relation Age of Onset   Hypertension Mother    Diabetes Other     Social History   Tobacco Use   Smoking status: Some Days    Packs/day: 0.10    Types: Cigarettes   Smokeless tobacco: Never  Vaping Use   Vaping Use: Never used  Substance Use Topics   Alcohol use: Not Currently   Drug use: Not Currently    Types: Marijuana    Home Medications Prior to Admission medications   Medication Sig Start Date End Date Taking? Authorizing Provider  aspirin 81 MG chewable tablet Chew 81 mg by mouth daily as needed for mild pain.    [provider]  HYDROcodone-acetaminophen (NORCO/VICODIN) 5-325 MG tablet Take 1-2 tablets by mouth every 6 hours as needed for pain and/or cough. 09/15/16   Pisciotta, Joni Reining, PA-C  ibuprofen (ADVIL,MOTRIN) 800 MG tablet Take 1 tablet (800 mg total) by mouth 3 (three) times daily. Patient taking differently: Take 800 mg by mouth every 8 (eight)  hours as needed for moderate pain.  02/13/14   Santiago Glad, PA-C    Allergies    Patient has no known allergies.  Review of Systems   Review of Systems  Constitutional:  Positive for fatigue and fever.  HENT:  Positive for congestion. Negative for sore throat.   Eyes:  Positive for visual disturbance.  Respiratory:  Positive for cough and shortness of breath.   Gastrointestinal:  Negative for vomiting.  Musculoskeletal:  Negative for neck pain and neck stiffness.  Skin:  Negative for rash.  Neurological:  Positive for headaches.  All other systems reviewed and are negative.  Physical Exam Updated Vital Signs BP 126/69 (BP Location: Right Arm)   Pulse 96   Temp 100.3 F (37.9 C) (Oral)   Resp (!) 22   Ht 5\' 8"  (1.727 m)   Wt 106.6 kg   SpO2 98%   BMI 35.73 kg/m   Physical Exam Vitals and nursing note reviewed.  Constitutional:      General: He is not in acute distress.    Appearance: He is well-developed. He is not ill-appearing or diaphoretic.  HENT:     Head: Normocephalic and atraumatic.     Right Ear: External ear normal.     Left Ear: External ear normal.     Nose: Nose normal.  Eyes:  General:        Right eye: No discharge.        Left eye: No discharge.     Extraocular Movements: Extraocular movements intact.     Pupils: Pupils are equal, round, and reactive to light.  Cardiovascular:     Rate and Rhythm: Normal rate and regular rhythm.     Heart sounds: Normal heart sounds.  Pulmonary:     Effort: Pulmonary effort is normal.     Breath sounds: Normal breath sounds.  Abdominal:     General: There is no distension.  Musculoskeletal:     Cervical back: Normal range of motion and neck supple. No rigidity.  Skin:    General: Skin is warm and dry.  Neurological:     Mental Status: He is alert.     Comments: CN 3-12 grossly intact. 5/5 strength in all 4 extremities. Grossly normal sensation. Normal finger to nose.   Psychiatric:        Mood and  Affect: Mood is not anxious.    ED Results / Procedures / Treatments   Labs (all labs ordered are listed, but only abnormal results are displayed) Labs Reviewed  RESP PANEL BY RT-PCR (FLU A&B, COVID) [id number]    EKG None  Radiology No results found.  Procedures Procedures   Medications Ordered in ED Medications  acetaminophen (TYLENOL) tablet 1,000 mg (has no administration in time range)    ED Course  I have reviewed the triage vital signs and the nursing notes.  Pertinent labs & imaging results that were available during my care of the patient were reviewed by me and considered in my medical decision making (see chart for details).    MDM Rules/Calculators/A&P                           Patient is well-appearing.  He does have a low-grade temperature here.  He would like to take some Tylenol for his headache.  Symptoms are all pretty acute within the last 6 hours or so.  Why does have a significant headache, my suspicion of acute CNS emergency is pretty low.  He is describing a flulike illness and further talking to patient, he had similar headache/visual complaints when he had the flu several years ago.  The more I talked to him he is describing more that his headache is bothersome more than true blurry vision and no double vision.  I do not think acute imaging is warranted.  We discussed that given this is likely to be a viral illness, we will hold off on antibiotics and he agrees.  Otherwise, no obvious source of bacterial infection but we did have a discussion about return precautions. Final Clinical Impression(s) / ED Diagnoses Final diagnoses:  Influenza-like illness    Rx / DC Orders ED Discharge Orders     None        Sherwood Gambler, MD 06/14/21 [phone]

## 2022-08-07 ENCOUNTER — Emergency Department (HOSPITAL_BASED_OUTPATIENT_CLINIC_OR_DEPARTMENT_OTHER)
Admission: EM | Admit: 2022-08-07 | Discharge: 2022-08-07 | Disposition: A | Discharge status: Home / Self Care | Payer: BC Managed Care – PPO | Attending: Emergency Medicine | Admitting: Emergency Medicine

## 2022-08-07 ENCOUNTER — Emergency Department (HOSPITAL_BASED_OUTPATIENT_CLINIC_OR_DEPARTMENT_OTHER): Payer: BC Managed Care – PPO

## 2022-08-07 ENCOUNTER — Other Ambulatory Visit: Payer: Self-pay

## 2022-08-07 ENCOUNTER — Emergency Department (HOSPITAL_BASED_OUTPATIENT_CLINIC_OR_DEPARTMENT_OTHER): Payer: BC Managed Care – PPO | Admitting: Radiology

## 2022-08-07 DIAGNOSIS — R29818 Other symptoms and signs involving the nervous system: Secondary | ICD-10-CM | POA: Diagnosis not present

## 2022-08-07 DIAGNOSIS — M79602 Pain in left arm: Secondary | ICD-10-CM | POA: Insufficient documentation

## 2022-08-07 DIAGNOSIS — Z7982 Long term (current) use of aspirin: Secondary | ICD-10-CM | POA: Diagnosis not present

## 2022-08-07 DIAGNOSIS — R0789 Other chest pain: Secondary | ICD-10-CM | POA: Diagnosis not present

## 2022-08-07 DIAGNOSIS — R079 Chest pain, unspecified: Secondary | ICD-10-CM | POA: Diagnosis not present

## 2022-08-07 DIAGNOSIS — Z1152 Encounter for screening for COVID-19: Secondary | ICD-10-CM | POA: Insufficient documentation

## 2022-08-07 LAB — COMPREHENSIVE METABOLIC PANEL
ALT: 17 U/L (ref 0–44)
AST: 19 U/L (ref 15–41)
Albumin: 4.8 g/dL (ref 3.5–5.0)
Alkaline Phosphatase: 45 U/L (ref 38–126)
Anion gap: 11 (ref 5–15)
BUN: 14 mg/dL (ref 6–20)
CO2: 25 mmol/L (ref 22–32)
Calcium: 9.6 mg/dL (ref 8.9–10.3)
Chloride: 103 mmol/L (ref 98–111)
Creatinine, Ser: 1 mg/dL (ref 0.61–1.24)
GFR, Estimated: >60 mL/min (ref >60)
Glucose, Bld: 117 mg/dL — ABNORMAL HIGH (ref 70–99)
Potassium: 3.6 mmol/L (ref 3.5–5.1)
Sodium: 139 mmol/L (ref 135–145)
Total Bilirubin: 0.4 mg/dL (ref 0.3–1.2)
Total Protein: 7.8 g/dL (ref 6.5–8.1)

## 2022-08-07 LAB — TROPONIN I (HIGH SENSITIVITY)
Troponin I (High Sensitivity): <2 ng/L (ref <18)
Troponin I (High Sensitivity): <2 ng/L (ref <18)

## 2022-08-07 LAB — CBC WITH DIFFERENTIAL/PLATELET
Abs Immature Granulocytes: 0.01 10*3/uL (ref 0.00–0.07)
Basophils Absolute: 0 10*3/uL (ref 0.0–0.1)
Basophils Relative: 1 %
Eosinophils Absolute: 0.1 10*3/uL (ref 0.0–0.5)
Eosinophils Relative: 1 %
HCT: 42.3 % (ref 39.0–52.0)
Hemoglobin: 14.5 g/dL (ref 13.0–17.0)
Immature Granulocytes: 0 %
Lymphocytes Relative: 30 %
Lymphs Abs: 1.8 10*3/uL (ref 0.7–4.0)
MCH: 32.8 pg (ref 26.0–34.0)
MCHC: 34.3 g/dL (ref 30.0–36.0)
MCV: 95.7 fL (ref 80.0–100.0)
Monocytes Absolute: 0.8 10*3/uL (ref 0.1–1.0)
Monocytes Relative: 14 %
Neutro Abs: 3.1 10*3/uL (ref 1.7–7.7)
Neutrophils Relative %: 54 %
Platelets: 272 10*3/uL (ref 150–400)
RBC: 4.42 MIL/uL (ref 4.22–5.81)
RDW: 14 % (ref 11.5–15.5)
WBC: 5.8 10*3/uL (ref 4.0–10.5)
nRBC: 0 % (ref 0.0–0.2)

## 2022-08-07 LAB — RESP PANEL BY RT-PCR (RSV, FLU A&B, COVID)  RVPGX2
Influenza A by PCR: NEGATIVE
Influenza B by PCR: NEGATIVE
Resp Syncytial Virus by PCR: NEGATIVE
SARS Coronavirus 2 by RT PCR: NEGATIVE

## 2022-08-07 MED ORDER — DILTIAZEM HCL-DEXTROSE 125-5 MG/125ML-% IV SOLN (PREMIX): 5.0000 mg/h | INTRAVENOUS | Status: DC

## 2022-08-07 MED ORDER — ENOXAPARIN SODIUM 100 MG/ML IJ SOSY
100.0000 mg | PREFILLED_SYRINGE | Freq: Once | INTRAMUSCULAR | Status: AC
  Administered 2022-08-07: 100 mg via SUBCUTANEOUS
  Filled 2022-08-07: qty 1
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
## 2022-08-07 NOTE — Discharge Instructions (Addendum)
Please return to [facility] Emergency Department after 9 AM tomorrow to have an ultrasound of your arm performed.    *  You were diagnosed with chest pain today.  This is a non-specific diagnosis, but your provider did not feel this was a life-threatening condition at this time.  Chest pain is a common presenting condition in the Emergency Department, and it is not uncommon for patients to leave without a specific diagnosis.  It is important to remember that your workup today was not a complete medical workup.  You may still have a serious medical attention that needs follow up care with a specialist. If your provider referred you to see a specialist, it is VERY important that you call to set up an appointment with them.    It is also important that you speak to your primary care provider in 1-2 days after this visit to the ER.  Your PCP may want to see you in the office, or else they may want you to follow up with the specialist.  SEEK IMMEDIATE MEDICAL ATTENTION IF:  You have severe chest pain, especially if the pain is crushing or pressure-like and spreads to the arms, back, neck, or jaw, or if you have sweating, nausea (feeling sick to your stomach), or shortness of breath. THIS IS AN EMERGENCY. Don't wait to see if the pain will go away. Get medical help at once. Call 911 or [phone] (operator). DO NOT drive yourself to the hospital.   Your chest pain gets worse and does not go away with rest.  You have an attack of chest pain lasting longer than usual, despite rest and treatment with the medications your caregiver has prescribed.  You wake from sleep with chest pain or shortness of breath.  You feel dizzy or faint.  You have chest pain not typical of your usual pain for which you originally saw your caregiver.

## 2022-08-07 NOTE — ED Provider Notes (Signed)
[facility] EMERGENCY DEPT Provider Note   CSN: [id number] Arrival date & time: 08/07/22  1701     History  Chief Complaint  Patient presents with   Arm Pain    Connor White is a 36 y.o. male presenting to emergency department with left arm pain and chest pain.  Patient reports this began while he was at work.  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  He says his left arm suddenly felt like he got very stiff and tight, he was also having squeezing pain in his chest.  He has never had this feeling before.  He denies headache, blurred vision.  He took aspirin and came to the ED.  He denies that he has any medical problems, reports he does not see the doctor regularly.  He denies any significant family history of MI or coronary disease.  He does smoke or vape nicotine.  The sensation of feeling is returned in his left arm but he still has some tension "like something is being pulled" in his upper left arm.  HPI     Home Medications Prior to Admission medications   Medication Sig Start Date End Date Taking? Authorizing Provider  aspirin 81 MG chewable tablet Chew 81 mg by mouth daily as needed for mild pain.    [provider]  HYDROcodone-acetaminophen (NORCO/VICODIN) 5-325 MG tablet Take 1-2 tablets by mouth every 6 hours as needed for pain and/or cough. 09/15/16   Pisciotta, Joni Reining, PA-C  ibuprofen (ADVIL,MOTRIN) 800 MG tablet Take 1 tablet (800 mg total) by mouth 3 (three) times daily. Patient taking differently: Take 800 mg by mouth every 8 (eight) hours as needed for moderate pain.  02/13/14   Santiago Glad, PA-C      Allergies    Patient has no known allergies.    Review of Systems   Review of Systems  Physical Exam Updated Vital Signs BP 128/81 (BP Location: Right Arm)   Pulse 60   Temp 98.5 F (36.9 C) (Oral)   Resp 18   SpO2 99%  Physical Exam Constitutional:      General: He is not in acute distress. HENT:     Head: Normocephalic and  atraumatic.  Eyes:     Conjunctiva/sclera: Conjunctivae normal.     Pupils: Pupils are equal, round, and reactive to light.  Cardiovascular:     Rate and Rhythm: Normal rate and regular rhythm.  Pulmonary:     Effort: Pulmonary effort is normal. No respiratory distress.  Abdominal:     General: There is no distension.     Tenderness: There is no abdominal tenderness.  Musculoskeletal:     Comments: Patient expresses some discomfort with overhead arm raise, tenderness mildly along the proximal upper left arm Also some tenderness along the lateral pectoral muscle  Skin:    General: Skin is warm and dry.  Neurological:     General: No focal deficit present.     Mental Status: He is alert and oriented to person, place, and time. Mental status is at baseline.  Psychiatric:        Mood and Affect: Mood normal.        Behavior: Behavior normal.     ED Results / Procedures / Treatments   Labs (all labs ordered are listed, but only abnormal results are displayed) Labs Reviewed  COMPREHENSIVE METABOLIC PANEL - Abnormal; Notable for the following components:      Result Value   Glucose, Bld 117 (*)  All other components within normal limits  RESP PANEL BY RT-PCR (RSV, FLU A&B, COVID)  RVPGX2  CBC WITH DIFFERENTIAL/PLATELET  TROPONIN I (HIGH SENSITIVITY)  TROPONIN I (HIGH SENSITIVITY)    EKG None  Radiology DG Chest 2 View  Result Date: 08/07/2022 CLINICAL DATA:  Left-sided chest pain. EXAM: CHEST - 2 VIEW COMPARISON:  09/16/2016. FINDINGS: The heart size and mediastinal contours are within normal limits. No consolidation, effusion, or pneumothorax. No acute osseous abnormality. IMPRESSION: No active cardiopulmonary disease. Electronically Signed   By: Brett Fairy M.D.   On: 08/07/2022 21:46   CT Head Wo Contrast  Result Date: 08/07/2022 CLINICAL DATA:  Neuro deficit, acute, stroke suspected EXAM: CT HEAD WITHOUT CONTRAST TECHNIQUE: Contiguous axial images were obtained  from the base of the skull through the vertex without intravenous contrast. RADIATION DOSE REDUCTION: This exam was performed according to the departmental dose-optimization program which includes automated exposure control, adjustment of the mA and/or kV according to patient size and/or use of iterative reconstruction technique. COMPARISON:  09/16/2016 FINDINGS: Brain: No evidence of acute infarction, hemorrhage, hydrocephalus, extra-axial collection or mass lesion/mass effect. Vascular: No hyperdense vessel or unexpected calcification. Skull: Normal. Negative for fracture or focal lesion. Sinuses/Orbits: No acute finding. Other: None. IMPRESSION: No acute intracranial abnormality. Electronically Signed   By: Davina Poke D.O.   On: 08/07/2022 17:50    Procedures Procedures    Medications Ordered in ED Medications  enoxaparin (LOVENOX) injection 100 mg (has no administration in time range)    ED Course/ Medical Decision Making/ A&P Clinical Course as of 08/07/22 2315  Sun Aug 07, 2022  2259 Patient reassessed and minimally symptomatic at this time.  Delta troponins are negative.  We are not able to do a vascular ultrasound tonight but he will return tomorrow morning to have this done.  Has been ordered in his discharge orders.  In the meantime we will give him Lovenox overnight.  Patient verbalized understanding and agreement with the plan. [MT]    Clinical Course User Index [MT] Morris Markham, Carola Rhine, MD                           Medical Decision Making Amount and/or Complexity of Data Reviewed Labs: ordered. Radiology: ordered.  Risk Prescription drug management.   This patient presents to the ED with concern for left-sided chest pain, arm heaviness. This involves an extensive number of treatment options, and is a complaint that carries with it a high risk of complications and morbidity.  The differential diagnosis includes ACS versus musculoskeletal pain versus DVT versus neuropathy  versus other  This presentation is highly inconsistent with stroke or CVA.  The patient did have CT imaging ordered from triage which I personally reviewed interpreted, showing no acute infarct or focal infarct.  I do not believe we need further MRI imaging of the brain, as again, he is low risk for stroke and this is not consistent with stroke.  Co-morbidities that complicate the patient evaluation: Smoking is a risk factor for coronary disease  Additional history obtained from patient's wife at the bedside  I ordered and personally interpreted labs.  The pertinent results include: Delta troponins are negative, COVID and flu is negative, labs are otherwise unremarkable  I ordered imaging studies including CT head, DG chest I independently visualized and interpreted imaging which showed no emergent findings I agree with the radiologist interpretation  The patient was maintained on a cardiac monitor.  I personally viewed and interpreted the cardiac monitored which showed an underlying rhythm of: Normal sinus rhythm  Per my interpretation the patient's ECG shows normal sinus rhythm no acute ischemic findings  I ordered medication including Lovenox for prophylaxis for DVT  I have reviewed the patients home medicines and have made adjustments as needed  Test Considered: Low suspicion clinically for pulmonary embolism, no tachycardia or hypoxia.    There is a moderate to high continued suspicion for proximal left arm DVT, however, as I discussed with the patient returning in the morning to have vascular imaging done.  He is in agreement with this plan.  After the interventions noted above, I reevaluated the patient and found that they have: improved  Dispostion:  After consideration of the diagnostic results and the patients response to treatment, I feel that the patent would benefit from return for vascular imaging of the upper arm.  Outpatient follow-up otherwise..  Overall, this  presentation is extremely atypical for aortic dissection or acute coronary syndrome.  I would expect a clinically significant cardiac or coronary event that would cause this type of weakness in the left arm to be associated with EKG changes or elevated troponin levels to suggest ischemia.  He is overall low risk for MACE with a HEART score of 2.         Final Clinical Impression(s) / ED Diagnoses Final diagnoses:  Left arm pain  Chest pain, unspecified type    Rx / DC Orders ED Discharge Orders          Ordered    US Venous Img Upper Uni Left        08/07/22 2257              Terald Sleeper, MD 08/07/22 2317

## 2022-08-08 ENCOUNTER — Ambulatory Visit (HOSPITAL_BASED_OUTPATIENT_CLINIC_OR_DEPARTMENT_OTHER)
Admission: RE | Admit: 2022-08-08 | Discharge: 2022-08-08 | Disposition: A | Discharge status: Home / Self Care | Payer: BC Managed Care – PPO | Source: Ambulatory Visit | Attending: Emergency Medicine | Admitting: Emergency Medicine

## 2022-08-08 DIAGNOSIS — M7989 Other specified soft tissue disorders: Secondary | ICD-10-CM | POA: Diagnosis not present

## 2022-08-08 DIAGNOSIS — M79622 Pain in left upper arm: Secondary | ICD-10-CM | POA: Diagnosis not present

## 2022-08-08 NOTE — ED Provider Notes (Signed)
Pt presents to the ED for follow up for DVT US ordered last night. No additional symptoms today. Notes that he is here for his results.   US Venous Img Upper Uni Left, Result Date: 08/08/2022 CLINICAL DATA:  Left chest and arm pain since yesterday. EXAM: LEFT UPPER EXTREMITY VENOUS DOPPLER ULTRASOUND TECHNIQUE: Gray-scale sonography with graded compression, as well as color Doppler and duplex ultrasound were performed to evaluate the upper extremity deep venous system from the level of the subclavian vein and including the jugular, axillary, basilic, radial, ulnar and upper cephalic vein. Spectral Doppler was utilized to evaluate flow at rest and with distal augmentation maneuvers. COMPARISON:  None Available. FINDINGS: Contralateral Subclavian Vein: Respiratory phasicity is normal and symmetric with the symptomatic side. No evidence of thrombus. Normal compressibility. Internal Jugular Vein: No evidence of thrombus. Normal compressibility, respiratory phasicity and response to augmentation. Subclavian Vein: No evidence of thrombus. Normal compressibility, respiratory phasicity and response to augmentation. Axillary Vein: No evidence of thrombus. Normal compressibility, respiratory phasicity and response to augmentation. Cephalic Vein: No evidence of thrombus. Normal compressibility, respiratory phasicity and response to augmentation. Basilic Vein: No evidence of thrombus. Normal compressibility, respiratory phasicity and response to augmentation. Brachial Veins: No evidence of thrombus. Normal compressibility, respiratory phasicity and response to augmentation. Radial Veins: No evidence of thrombus. Normal compressibility, respiratory phasicity and response to augmentation. Ulnar Veins: No evidence of thrombus. Normal compressibility, respiratory phasicity and response to augmentation. Venous Reflux:  None visualized. Other Findings:  None visualized. IMPRESSION: No evidence of DVT within the left upper  extremity. Electronically Signed   By: Amie Portland M.D.   On: 08/08/2022 10:28    Discussed with patient results of DVT ultrasound study.  Discussed with patient to follow-up in outpatient setting with primary care provider.  Answered all available questions.  Patient appears safe for discharge at this time.    This chart was dictated using voice recognition software, Dragon. Despite the best efforts of this provider to proofread and correct errors, errors may still occur which can change documentation meaning.    Ryelle Ruvalcaba A, PA-C 08/08/22 1134    Mardene Sayer, MD 08/08/22 [phone]

## 2022-11-02 ENCOUNTER — Ambulatory Visit (HOSPITAL_BASED_OUTPATIENT_CLINIC_OR_DEPARTMENT_OTHER): Payer: BC Managed Care – PPO | Admitting: Family Medicine

## 2022-11-02 ENCOUNTER — Encounter (HOSPITAL_BASED_OUTPATIENT_CLINIC_OR_DEPARTMENT_OTHER): Payer: Self-pay | Admitting: Family Medicine

## 2022-11-02 VITALS — BP 114/80 | HR 59 | Temp 97.7°F | Ht 68.0 in | Wt 245.3 lb

## 2022-11-02 DIAGNOSIS — G4730 Sleep apnea, unspecified: Secondary | ICD-10-CM | POA: Diagnosis not present

## 2022-11-02 DIAGNOSIS — Z7689 Persons encountering health services in other specified circumstances: Secondary | ICD-10-CM | POA: Diagnosis not present

## 2022-11-02 DIAGNOSIS — Z0000 Encounter for general adult medical examination without abnormal findings: Secondary | ICD-10-CM

## 2022-11-02 NOTE — Progress Notes (Signed)
New Patient Office Visit  Subjective    Patient ID: Connor White, male    DOB: 1986-07-23  Age: 37 y.o. MRN: [id number]  CC:  Chief Complaint  Patient presents with   Establish Care    HPI [name] presents to establish care Last PCP - many years ago, pediatrician  Sleep apnea concerns - patient reports that his wife has concerns with him snoring at night, trouble breathing when sleeping. Has had issues for many years, about 10 years.   Had an episode of left arm pain about 3 months ago for which he presented to ED. Had evaluation for cardiac cause, DVT, workup was negative/reassuring. Has not had recurrence of symptoms.  Patient is originally from [place], [place]. Has lived here since 1996. 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   Outpatient Encounter Medications as of 11/02/2022  Medication Sig   aspirin 81 MG chewable tablet Chew 81 mg by mouth daily as needed for mild pain.   [DISCONTINUED] HYDROcodone-acetaminophen (NORCO/VICODIN) 5-325 MG tablet Take 1-2 tablets by mouth every 6 hours as needed for pain and/or cough. (Patient not taking: Reported on 11/02/2022)   [DISCONTINUED] ibuprofen (ADVIL,MOTRIN) 800 MG tablet Take 1 tablet (800 mg total) by mouth 3 (three) times daily. (Patient not taking: Reported on 11/02/2022)   No facility-administered encounter medications on file as of 11/02/2022.    Past Medical History:  Diagnosis Date   Sinusitis     No past surgical history on file.  Family History  Problem Relation Age of Onset   Hypertension Mother    Diabetes Other     Social History   Socioeconomic History   Marital status: Married    Spouse name: Not on file   Number of children: Not on file   Years of education: Not on file   Highest education level: Not on file  Occupational History   Not on file  Tobacco Use   Smoking status: Former    Packs/day: .1    Types: Cigarettes   Smokeless  tobacco: Never  Vaping Use   Vaping Use: Never used  Substance and Sexual Activity   Alcohol use: Not Currently   Drug use: Not Currently    Types: Marijuana   Sexual activity: Not on file  Other Topics Concern   Not on file  Social History Narrative   Not on file   Social Determinants of Health   Financial Resource Strain: Not on file  Food Insecurity: Not on file  Transportation Needs: Not on file  Physical Activity: Not on file  Stress: Not on file  Social Connections: Not on file  Intimate Partner Violence: Not on file    Objective    BP 114/80   Pulse (!) 59   Temp 97.7 F (36.5 C) (Oral)   Ht  (1.727 m)   Wt 245 lb 4.8 oz (111.3 kg)   SpO2 100%   BMI 37.30 kg/m   Physical Exam  37 year old male in no acute distress Cardiovascular exam with regular rate and rhythm, no murmur appreciated Lungs clear to auscultation bilaterally  Assessment & Plan:   Problem List Items Addressed This Visit       Respiratory   Sleep apnea - Primary    Concern for underlying sleep apnea given patient history, risk factors, Epworth Sleepiness Scale.  ESS with score 13, BMI greater than 30.  Recommend further evaluation to assess for sleep apnea.  Discussed  risks related to undiagnosed/untreated sleep apnea.  Patient in agreement to proceed with testing, referral placed to sleep medicine specialist today      Relevant Orders   Ambulatory referral to Neurology     Other   Encounter to establish care    Concern for potential sleep apnea as outlined above.  Will plan to follow-up in about 4 to 6 months to complete CPE, patient will have labs completed about a week before.  If any new concerns or issues arise before scheduled appointment, advised on returning to the office sooner as needed       Return in about 5 months (around 04/04/2023) for CPE.   Burnetta Kohls J De [place], MD

## 2022-12-04 DIAGNOSIS — Z0000 Encounter for general adult medical examination without abnormal findings: Secondary | ICD-10-CM | POA: Insufficient documentation

## 2022-12-04 DIAGNOSIS — Z7689 Persons encountering health services in other specified circumstances: Secondary | ICD-10-CM | POA: Insufficient documentation

## 2022-12-04 NOTE — Assessment & Plan Note (Signed)
Concern for potential sleep apnea as outlined above.  Will plan to follow-up in about 4 to 6 months to complete CPE, patient will have labs completed about a week before.  If any new concerns or issues arise before scheduled appointment, advised on returning to the office sooner as needed

## 2022-12-04 NOTE — Assessment & Plan Note (Signed)
Concern for underlying sleep apnea given patient history, risk factors, Epworth Sleepiness Scale.  ESS with score 13, BMI greater than 30.  Recommend further evaluation to assess for sleep apnea.  Discussed risks related to undiagnosed/untreated sleep apnea.  Patient in agreement to proceed with testing, referral placed to sleep medicine specialist today

## 2022-12-13 ENCOUNTER — Ambulatory Visit: Payer: BLUE CROSS/BLUE SHIELD | Admitting: Emergency Medicine

## 2023-04-18 ENCOUNTER — Encounter (HOSPITAL_BASED_OUTPATIENT_CLINIC_OR_DEPARTMENT_OTHER): Payer: Self-pay | Admitting: Family Medicine

## 2023-04-18 ENCOUNTER — Ambulatory Visit (INDEPENDENT_AMBULATORY_CARE_PROVIDER_SITE_OTHER): Payer: BC Managed Care – PPO | Admitting: Family Medicine

## 2023-04-18 VITALS — BP 133/92 | HR 77 | Ht 68.0 in | Wt 252.9 lb

## 2023-04-18 DIAGNOSIS — Z0000 Encounter for general adult medical examination without abnormal findings: Secondary | ICD-10-CM | POA: Diagnosis not present

## 2023-04-18 DIAGNOSIS — G4730 Sleep apnea, unspecified: Secondary | ICD-10-CM

## 2023-04-18 NOTE — Assessment & Plan Note (Signed)
Routine HCM labs ordered. HCM reviewed/discussed. Anticipatory guidance regarding healthy weight, lifestyle and choices given. Recommend healthy diet.  Recommend approximately 150 minutes/week of moderate intensity exercise Recommend regular dental and vision exams Always use seatbelt/lap and shoulder restraints Recommend using smoke alarms and checking batteries at least twice a year Recommend using sunscreen when outside Discussed tetanus immunization recommendations, patient is UTD Recommend seasonal influenza vaccine, patient reports receiving through work, will send [place] documentation

## 2023-04-18 NOTE — Assessment & Plan Note (Signed)
Concern for underlying sleep apnea given patient history, risk factors, Epworth Sleepiness Scale.  ESS with score 13, BMI greater than 30.  Recommend further evaluation to assess for sleep apnea.  Discussed risks related to undiagnosed/untreated sleep apnea.  Patient in agreement to proceed with testing, referral placed to sleep medicine specialist today

## 2023-04-18 NOTE — Progress Notes (Signed)
Subjective:    CC: Annual Physical Exam  HPI:  Connor White is a 37 y.o. presenting for annual physical  I reviewed the past medical history, family history, social history, surgical history, and allergies today and no changes were needed.  Please see the problem list section below in epic for further details.  Past Medical History: Past Medical History:  Diagnosis Date   GERD (gastroesophageal reflux disease) 08/14/2022   Sinusitis    Past Surgical History: No past surgical history on file. Social History: Social History   Socioeconomic History   Marital status: Married    Spouse name: Not on file   Number of children: Not on file   Years of education: Not on file   Highest education level: Not on file  Occupational History   Not on file  Tobacco Use   Smoking status: Former    Current packs/day: 0.10    Types: Cigarettes   Smokeless tobacco: Never  Vaping Use   Vaping status: Never Used  Substance and Sexual Activity   Alcohol use: Not Currently   Drug use: Never    Types: Marijuana   Sexual activity: Not Currently    Birth control/protection: Abstinence  Other Topics Concern   Not on file  Social History Narrative   Not on file   Social Determinants of Health   Financial Resource Strain: Not on file  Food Insecurity: Not on file  Transportation Needs: Not on file  Physical Activity: Not on file  Stress: Not on file  Social Connections: Not on file   Family History: Family History  Problem Relation Age of Onset   Hypertension Mother    Diabetes Other    Hypertension Father    Alcohol abuse Maternal Grandfather    Allergies: No Known Allergies Medications: See med rec.  Review of Systems: No headache, visual changes, nausea, vomiting, diarrhea, constipation, dizziness, abdominal pain, skin rash, fevers, chills, night sweats, swollen lymph nodes, weight loss, chest pain, body aches, joint swelling, muscle aches, shortness of breath, mood  changes, visual or auditory hallucinations.  Objective:    BP 138/84   Pulse 77   Wt 252 lb 14.4 oz (114.7 kg)   SpO2 100%   BMI 38.45 kg/m   General: Well Developed, well nourished, and in no acute distress. Neuro: Alert and oriented x3, extra-ocular muscles intact, sensation grossly intact. Cranial nerves II through XII are intact, motor, sensory, and coordinative functions are all intact. HEENT: Normocephalic, atraumatic, pupils equal round reactive to light, neck supple, no masses, no lymphadenopathy, thyroid nonpalpable. Oropharynx, nasopharynx, external ear canals are unremarkable. Skin: Warm and dry, no rashes noted. Cardiac: Regular rate and rhythm, no murmurs rubs or gallops. Respiratory: Clear to auscultation bilaterally. Not using accessory muscles, speaking in full sentences. Abdominal: Soft, nontender, nondistended, positive bowel sounds, no masses, no organomegaly. Musculoskeletal: Shoulder, elbow, wrist, hip, knee, ankle stable, and with full range of motion.  Impression and Recommendations:    Wellness examination Assessment & Plan: Routine HCM labs ordered. HCM reviewed/discussed. Anticipatory guidance regarding healthy weight, lifestyle and choices given. Recommend healthy diet.  Recommend approximately 150 minutes/week of moderate intensity exercise Recommend regular dental and vision exams Always use seatbelt/lap and shoulder restraints Recommend using smoke alarms and checking batteries at least twice a year Recommend using sunscreen when outside Discussed tetanus immunization recommendations, patient is UTD Recommend seasonal influenza vaccine, patient reports receiving through work, will send [place] documentation  Orders: -     Hemoglobin A1c -  TSH Rfx on Abnormal to Free T4 -     Lipid panel -     Comprehensive metabolic panel -     CBC with Differential/Platelet  Sleep apnea, unspecified type Assessment & Plan: Concern for underlying sleep apnea  given patient history, risk factors, Epworth Sleepiness Scale.  ESS with score 13, BMI greater than 30.  Recommend further evaluation to assess for sleep apnea.  Discussed risks related to undiagnosed/untreated sleep apnea.  Patient in agreement to proceed with testing, referral placed to sleep medicine specialist today  Orders: -     Ambulatory referral to Neurology  Return in about 1 year (around 04/17/2024) for CPE.   ___________________________________________ Tylon Kemmerling de [place], MD, ABFM, [facility] Primary Care and Sports Medicine [facility] [place]

## 2023-04-19 LAB — COMPREHENSIVE METABOLIC PANEL
ALT: 17 IU/L (ref 0–44)
AST: 21 IU/L (ref 0–40)
Albumin: 4.3 g/dL (ref 4.1–5.1)
Alkaline Phosphatase: 57 IU/L (ref 44–121)
BUN/Creatinine Ratio: 10 (ref 9–20)
BUN: 11 mg/dL (ref 6–20)
Bilirubin Total: 0.5 mg/dL (ref 0.0–1.2)
CO2: 24 mmol/L (ref 20–29)
Calcium: 9.4 mg/dL (ref 8.7–10.2)
Chloride: 103 mmol/L (ref 96–106)
Creatinine, Ser: 1.06 mg/dL (ref 0.76–1.27)
Globulin, Total: 2.6 g/dL (ref 1.5–4.5)
Glucose: 90 mg/dL (ref 70–99)
Potassium: 5.6 mmol/L — ABNORMAL HIGH (ref 3.5–5.2)
Sodium: 137 mmol/L (ref 134–144)
Total Protein: 6.9 g/dL (ref 6.0–8.5)
eGFR: 94 mL/min/{1.73_m2} (ref >59)

## 2023-04-19 LAB — HEMOGLOBIN A1C
Est. average glucose Bld gHb Est-mCnc: 111 mg/dL
Hgb A1c MFr Bld: 5.5 % (ref 4.8–5.6)

## 2023-04-19 LAB — LIPID PANEL
Chol/HDL Ratio: 1.9 ratio (ref 0.0–5.0)
Cholesterol, Total: 149 mg/dL (ref 100–199)
HDL: 79 mg/dL (ref >39)
LDL Chol Calc (NIH): 59 mg/dL (ref 0–99)
Triglycerides: 51 mg/dL (ref 0–149)
VLDL Cholesterol Cal: 11 mg/dL (ref 5–40)

## 2023-04-19 LAB — CBC WITH DIFFERENTIAL/PLATELET
Basophils Absolute: 0 10*3/uL (ref 0.0–0.2)
Basos: 1 %
EOS (ABSOLUTE): 0.1 10*3/uL (ref 0.0–0.4)
Eos: 2 %
Hematocrit: 42.5 % (ref 37.5–51.0)
Hemoglobin: 14.6 g/dL (ref 13.0–17.7)
Immature Grans (Abs): 0 10*3/uL (ref 0.0–0.1)
Immature Granulocytes: 0 %
Lymphocytes Absolute: 1.4 10*3/uL (ref 0.7–3.1)
Lymphs: 28 %
MCH: 32 pg (ref 26.6–33.0)
MCHC: 34.4 g/dL (ref 31.5–35.7)
MCV: 93 fL (ref 79–97)
Monocytes Absolute: 0.7 10*3/uL (ref 0.1–0.9)
Monocytes: 14 %
Neutrophils Absolute: 2.8 10*3/uL (ref 1.4–7.0)
Neutrophils: 55 %
Platelets: 269 10*3/uL (ref 150–450)
RBC: 4.56 x10E6/uL (ref 4.14–5.80)
RDW: 12.7 % (ref 11.6–15.4)
WBC: 4.9 10*3/uL (ref 3.4–10.8)

## 2023-04-19 LAB — TSH RFX ON ABNORMAL TO FREE T4: TSH: 0.811 u[IU]/mL (ref 0.450–4.500)

## 2023-04-27 ENCOUNTER — Other Ambulatory Visit (HOSPITAL_BASED_OUTPATIENT_CLINIC_OR_DEPARTMENT_OTHER): Payer: Self-pay | Admitting: Family Medicine

## 2023-04-27 DIAGNOSIS — E875 Hyperkalemia: Secondary | ICD-10-CM

## 2023-05-02 ENCOUNTER — Other Ambulatory Visit (HOSPITAL_BASED_OUTPATIENT_CLINIC_OR_DEPARTMENT_OTHER): Payer: BC Managed Care – PPO

## 2023-05-02 DIAGNOSIS — E875 Hyperkalemia: Secondary | ICD-10-CM

## 2023-05-25 ENCOUNTER — Encounter (HOSPITAL_BASED_OUTPATIENT_CLINIC_OR_DEPARTMENT_OTHER): Payer: Self-pay | Admitting: Family Medicine

## 2023-08-18 ENCOUNTER — Encounter (HOSPITAL_COMMUNITY): Payer: Self-pay | Admitting: Emergency Medicine

## 2023-08-18 ENCOUNTER — Emergency Department (HOSPITAL_COMMUNITY): Payer: BC Managed Care – PPO

## 2023-08-18 ENCOUNTER — Emergency Department (HOSPITAL_COMMUNITY)
Admission: EM | Admit: 2023-08-18 | Discharge: 2023-08-18 | Disposition: A | Discharge status: Home / Self Care | Payer: Worker's Compensation | Attending: Emergency Medicine | Admitting: Emergency Medicine

## 2023-08-18 DIAGNOSIS — S61314A Laceration without foreign body of right ring finger with damage to nail, initial encounter: Secondary | ICD-10-CM | POA: Diagnosis present

## 2023-08-18 DIAGNOSIS — Y990 Civilian activity done for income or pay: Secondary | ICD-10-CM | POA: Diagnosis not present

## 2023-08-18 DIAGNOSIS — W260XXA Contact with knife, initial encounter: Secondary | ICD-10-CM | POA: Insufficient documentation

## 2023-08-18 MED ORDER — LIDOCAINE HCL (PF) 1 % IJ SOLN
5.0000 mL | Freq: Once | INTRAMUSCULAR | Status: AC
  Administered 2023-08-18: 5 mL
  Filled 2023-08-18: qty 30

## 2023-08-18 NOTE — ED Provider Notes (Signed)
 [place] EMERGENCY DEPARTMENT AT [facility] Provider Note   CSN: [id number] Arrival date & time: 08/18/23  1216     History  Chief Complaint  Patient presents with   Finger Injury    Connor White is a 38 y.o. male.  The history is provided by the patient.   Patient was the ED with a finger laceration to his right ring finger after cutting it with a knife at work.  His last shot was within the last year.  Knife was clean.  Able to move normally with full range of motion.  Denies numbness tingling, weakness.    Home Medications Prior to Admission medications   Medication Sig Start Date End Date Taking? Authorizing Provider  aspirin 81 MG chewable tablet Chew 81 mg by mouth daily as needed for mild pain.    [provider]      Allergies    Patient has no known allergies.    Review of Systems   Review of Systems  Skin:  Positive for wound.  All other systems reviewed and are negative.   Physical Exam Updated Vital Signs BP (!) 131/92 (BP Location: Right Arm)   Pulse 74   Temp 97.7 F (36.5 C) (Oral)   Resp 16   Ht 5' 8 (1.727 m)   Wt 102.7 kg   SpO2 97%   BMI 34.44 kg/m  Physical Exam Vitals and nursing note reviewed.  Constitutional:      Appearance: Normal appearance.  HENT:     Head: Normocephalic and atraumatic.  Eyes:     Extraocular Movements: Extraocular movements intact.     Conjunctiva/sclera: Conjunctivae normal.  Cardiovascular:     Rate and Rhythm: Normal rate and regular rhythm.     Pulses: Normal pulses.     Heart sounds: Normal heart sounds. No murmur heard.    No friction rub. No gallop.  Pulmonary:     Effort: Pulmonary effort is normal. No respiratory distress.     Breath sounds: Normal breath sounds.  Abdominal:     General: Abdomen is flat.     Palpations: Abdomen is soft.     Tenderness: There is no abdominal tenderness.  Musculoskeletal:        General: Signs of injury (2 and half centimeter  laceration to the right ring finger noted cutting into the nailbed and through to the other side of finger.  Leaving a flap of skin.  Does not appear to affect bone.) present.  Skin:    General: Skin is warm and dry.  Neurological:     General: No focal deficit present.     Mental Status: He is alert. Mental status is at baseline.  Psychiatric:        Mood and Affect: Mood normal.     ED Results / Procedures / Treatments   Labs (all labs ordered are listed, but only abnormal results are displayed) Labs Reviewed - No data to display  EKG None  Radiology No results found.  Procedures .Laceration Repair  Date/Time: 08/18/2023 2:07 PM  Performed by: Beola Terrall RAMAN, PA-C Authorized by: Beola Terrall RAMAN, PA-C   Consent:    Consent obtained:  Verbal   Consent given by:  Patient   Risks, benefits, and alternatives were discussed: yes     Risks discussed:  Infection, pain and need for additional repair   Alternatives discussed:  No treatment Universal protocol:    Procedure explained and questions answered to patient or  proxy's satisfaction: yes     Imaging studies available: yes     Patient identity confirmed:  Verbally with patient and arm band Anesthesia:    Anesthesia method:  Local infiltration   Local anesthetic:  Lidocaine  1% w/o epi Laceration details:    Location:  Finger   Finger location:  R ring finger   Length (cm):  2.5   Depth (mm):  1 Pre-procedure details:    Preparation:  Patient was prepped and draped in usual sterile fashion and imaging obtained to evaluate for foreign bodies Exploration:    Hemostasis achieved with:  Direct pressure   Imaging obtained: x-ray     Imaging outcome: foreign body not noted     Wound exploration: wound explored through full range of motion and entire depth of wound visualized   Treatment:    Area cleansed with:  Povidone-iodine and saline   Amount of cleaning:  Extensive   Irrigation solution:  Sterile saline   Irrigation  volume:  50   Irrigation method:  Pressure wash and syringe Skin repair:    Repair method:  Sutures   Suture size:  4-0   Suture material:  Prolene   Suture technique:  Simple interrupted   Number of sutures:  3 Approximation:    Approximation:  Close Repair type:    Repair type:  Simple Post-procedure details:    Dressing:  Adhesive bandage and non-adherent dressing   Procedure completion:  Tolerated well, no immediate complications     Medications Ordered in ED Medications  lidocaine  (PF) (XYLOCAINE ) 1 % injection 5 mL (5 mLs Infiltration Given 08/18/23 1342)    ED Course/ Medical Decision Making/ A&P                                 Medical Decision Making Amount and/or Complexity of Data Reviewed Radiology: ordered.  Risk Prescription drug management.   This patient is a 38 year old male who presents to the ED for concern of right finger laceration to his ring finger.   Differential diagnoses prior to evaluation: The emergent differential diagnosis includes, but is not limited to, laceration, nerve damage, ligamentous damage, fracture, foreign body. This is not an exhaustive differential.   Past Medical History / Co-morbidities / Social History: Sleep apnea, GERD.  Has sustained multiple lacerations to his fingers over the course of the year.  Was seen last year for laceration where he was given a tetanus shot.  Additional history: Chart reviewed. Pertinent results include: Last tetanus was given in May 2024  Lab Tests/Imaging studies: I personally interpreted labs/imaging and the pertinent results include:    X-ray of right ring finger showed no foreign body, fracture. I agree with the radiologist interpretation.    Medications: I ordered medication including lidocaine  infiltration for local anesthesia.  I have reviewed the patients home medicines and have made adjustments as needed.  ED Course:  Patient is very fibromyalgia to the ED 2 hours after  suffering a laceration to his right ring finger during work.  He says he cut this with a chef's knife.  He has also sustained multiple lacerations to his hands over the course of his work over the years.  Last tetanus shot was May 2024.  Wound was clean, knife was clean.  X-ray showed no foreign body or fracture.  Area was then prepped and cleaned appropriately and then sutured.  Area was then dressed.  Patient was  given strict return to ER precautions and signs of infection to be then reevaluated for if seen.  Patient was told to follow-up with PCP or urgent care in 7 to 10 days for suture removal and given wound care instructions.  Patient was also alerted to when to return to ER.  Patient tolerated procedure well.  Patient vitals were stable.  Patient is stable to discharge.    Disposition: After consideration of the diagnostic results and the patients response to treatment, I feel that patient benefit from discharge and treatment as above.   emergency department workup does not suggest an emergent condition requiring admission or immediate intervention beyond what has been performed at this time. The plan is: Wound care, return to PCP/urgent care in 7 to 10 days for suture removal, return for new or worsening symptoms. The patient is safe for discharge and has been instructed to return immediately for worsening symptoms, change in symptoms or any other concerns.  Final Clinical Impression(s) / ED Diagnoses Final diagnoses:  Laceration of right ring finger without foreign body with damage to nail, initial encounter    Rx / DC Orders ED Discharge Orders     None         Beola Terrall GORMAN DEVONNA 08/18/23 1456    Randol Simmonds, MD 08/18/23 1723

## 2023-08-18 NOTE — ED Provider Triage Note (Signed)
 Emergency Medicine Provider Triage Evaluation Note  Connor White , a 38 y.o. male  was evaluated in triage.  Pt complains of L ring finger injury, cut through nail and tip of finger with chef knife. Tetanus shot within the last year.   Review of Systems  Positive: Finger pain Negative: Numbness, tingling, weakness  Physical Exam  BP (!) 131/92 (BP Location: Right Arm)   Pulse 74   Temp 97.7 F (36.5 C) (Oral)   Resp 16   Ht 5' 8 (1.727 m)   Wt 102.7 kg   SpO2 97%   BMI 34.44 kg/m  Gen:   Awake, no distress   Resp:  Normal effort  MSK:   Moves extremities without difficulty  Other:    Medical Decision Making  Medically screening exam initiated at 1:13 PM.  Appropriate orders placed.  Connor White was informed that the remainder of the evaluation will be completed by another provider, this initial triage assessment does not replace that evaluation, and the importance of remaining in the ED until their evaluation is complete.     Connor White, [place] 08/18/23 1316

## 2023-08-18 NOTE — Discharge Instructions (Addendum)
 You were seen for a laceration of the ring finger of your right hand.  Be sure to change bandage daily.  Clean daily.  With soap and water.  Have stitches removed after 7 to 10 days via going to your PCP and/or urgent care.  Signs of infection include warmness to the touch, swelling, discolored drainage, worsening pain, fever.  if you are experiencing any symptoms return to the ER for immediate valuation  It was a pleasure seeing you in the ER today.-Here.

## 2023-08-18 NOTE — ED Triage Notes (Signed)
 Pt cut left ring finger on knife at work at 1030 today. Bandage on finger. Pt reports tip and fingernail is damaged.

## 2024-04-18 ENCOUNTER — Ambulatory Visit (INDEPENDENT_AMBULATORY_CARE_PROVIDER_SITE_OTHER): Payer: BC Managed Care – PPO | Admitting: Family Medicine

## 2024-04-18 VITALS — BP 135/88 | HR 70 | Ht 68.0 in | Wt 258.4 lb

## 2024-04-18 DIAGNOSIS — Z0000 Encounter for general adult medical examination without abnormal findings: Secondary | ICD-10-CM | POA: Diagnosis not present

## 2024-04-18 DIAGNOSIS — H9193 Unspecified hearing loss, bilateral: Secondary | ICD-10-CM | POA: Diagnosis not present

## 2024-04-18 DIAGNOSIS — G4730 Sleep apnea, unspecified: Secondary | ICD-10-CM | POA: Diagnosis not present

## 2024-04-18 NOTE — Progress Notes (Signed)
 Subjective:    CC: Annual Physical Exam  HPI: Connor White is a 38 y.o. presenting for annual physical  I reviewed the past medical history, family history, social history, surgical history, and allergies today and no changes were needed.  Please see the problem list section below in epic for further details.  Past Medical History: Past Medical History:  Diagnosis Date   GERD (gastroesophageal reflux disease) 08/14/2022   Sinusitis    Past Surgical History: History reviewed. No pertinent surgical history. Social History: Social History   Socioeconomic History   Marital status: Married    Spouse name: Not on file   Number of children: Not on file   Years of education: Not on file   Highest education level: Associate degree: academic program  Occupational History   Not on file  Tobacco Use   Smoking status: Former    Current packs/day: 0.00    Average packs/day: 0.1 packs/day for 5.0 years (0.5 ttl pk-yrs)    Types: Cigarettes    Quit date: 03/24/2022    Years since quitting: 2.0    Passive exposure: Past   Smokeless tobacco: Never   Tobacco comments:    I don't smoke anymore  Vaping Use   Vaping status: Never Used  Substance and Sexual Activity   Alcohol use: Not Currently   Drug use: Never    Types: Marijuana   Sexual activity: Not Currently    Birth control/protection: Abstinence  Other Topics Concern   Not on file  Social History Narrative   Not on file   Social Drivers of Health   Financial Resource Strain: Medium Risk (04/18/2024)   Overall Financial Resource Strain (CARDIA)    Difficulty of Paying Living Expenses: Somewhat hard  Food Insecurity: No Food Insecurity (04/18/2024)   Hunger Vital Sign    Worried About Running Out of Food in the Last Year: Never true    Ran Out of Food in the Last Year: Never true  Transportation Needs: No Transportation Needs (04/18/2024)   PRAPARE - Administrator, Civil Service (Medical): No    Lack of  Transportation (Non-Medical): No  Physical Activity: Insufficiently Active (04/18/2024)   Exercise Vital Sign    Days of Exercise per Week: 2 days    Minutes of Exercise per Session: 20 min  Stress: No Stress Concern Present (04/18/2024)   [employer] of Occupational Health - Occupational Stress Questionnaire    Feeling of Stress: Only a little  Social Connections: Socially Isolated (04/18/2024)   Social Connection and Isolation Panel    Frequency of Communication with Friends and Family: Once a week    Frequency of Social Gatherings with Friends and Family: Once a week    Attends Religious Services: Never    Database administrator or Organizations: No    Attends Engineer, structural: Not on file    Marital Status: Married   Family History: Family History  Problem Relation Age of Onset   Hypertension Mother    Diabetes Other    Hypertension Father    Alcohol abuse Maternal Grandfather    Allergies: No Known Allergies Medications: See med rec.  Review of Systems: No headache, visual changes, nausea, vomiting, diarrhea, constipation, dizziness, abdominal pain, skin rash, fevers, chills, night sweats, swollen lymph nodes, weight loss, chest pain, body aches, joint swelling, muscle aches, shortness of breath, mood changes, visual or auditory hallucinations.  Objective:    BP (!) 130/91 (BP Location: Left Arm, Patient  Position: Sitting, Cuff Size: Large)   Pulse 70   Ht 5' 8 (1.727 m)   Wt 258 lb 6.4 oz (117.2 kg)   SpO2 100%   BMI 39.29 kg/m   General: Well Developed, well nourished, and in no acute distress.  Neuro: Alert and oriented x3, extra-ocular muscles intact, sensation grossly intact. Cranial nerves II through XII are intact, motor, sensory, and coordinative functions are all intact. HEENT: Normocephalic, atraumatic, pupils equal round reactive to light, neck supple, no masses, no lymphadenopathy, thyroid nonpalpable. Oropharynx, nasopharynx, external ear  canals are unremarkable. Skin: Warm and dry, no rashes noted.  Cardiac: Regular rate and rhythm, no murmurs rubs or gallops.  Respiratory: Clear to auscultation bilaterally. Not using accessory muscles, speaking in full sentences.  Abdominal: Soft, nontender, nondistended, positive bowel sounds, no masses, no organomegaly.  Musculoskeletal: Shoulder, elbow, wrist, hip, knee, ankle stable, and with full range of motion.  Impression and Recommendations:    Wellness examination Assessment & Plan: Routine HCM labs ordered. HCM reviewed/discussed. Anticipatory guidance regarding healthy weight, lifestyle and choices given. Recommend healthy diet.  Recommend approximately 150 minutes/week of moderate intensity exercise Recommend regular dental and vision exams Always use seatbelt/lap and shoulder restraints Recommend using smoke alarms and checking batteries at least twice a year Recommend using sunscreen when outside Discussed immunization recommendations  Orders: -     CBC with Differential/Platelet -     Comprehensive metabolic panel with GFR -     Hemoglobin A1c -     Lipid panel -     TSH Rfx on Abnormal to Free T4  Sleep apnea, unspecified type Assessment & Plan: Previously placed referral to sleep medicine, but patient ultimately did not schedule appointment. Still interested but notes difficulty with arranging around work schedule. Referral placed again today.  Orders: -     Ambulatory referral to Neurology  Decreased hearing of both ears Assessment & Plan: Wife notes he may have issue with hearing and mentioned possible ENT referral. Patient denies any sudden hearing loss, no ear pain. Discussed options, we can proceed with initial audiology assessment and determine next steps from there  Orders: -     Ambulatory referral to Audiology  Return in about 1 year (around 04/18/2025) for CPE.   ___________________________________________ Jowanda Heeg de [place], MD, ABFM,  [facility] Primary Care and Sports Medicine [facility]

## 2024-04-18 NOTE — Patient Instructions (Signed)
  Medication Instructions:  Your physician recommends that you continue on your current medications as directed. Please refer to the Current Medication list given to you today. --If you need a refill on any your medications before your next appointment, please call your pharmacy first. If no refills are authorized on file call the office.-- Lab Work: Your physician has recommended that you have lab work today: today  If you have labs (blood work) drawn today and your tests are completely normal, you will receive your results via MyChart message OR a phone call from our staff.  Please ensure you check your voicemail in the event that you authorized detailed messages to be left on a delegated number. If you have any lab test that is abnormal or we need to change your treatment, we will call you to review the results.  Referrals/Procedures/Imaging: Sleep Specialist  Audiologist   Follow-Up: Your next appointment:   Your physician recommends that you schedule a follow-up appointment in: 1 year physical  with Dr. de [place]  You will receive a text message or e-mail with a link to a survey about your care and experience with us  today! We would greatly appreciate your feedback!   Thanks for letting us  be apart of your health journey!!  Primary Care and Sports Medicine   Dr. Quintin sheerer Peru   We encourage you to activate your patient portal called MyChart.  Sign up information is provided on this After Visit Summary.  MyChart is used to connect with patients for Virtual Visits (Telemedicine).  Patients are able to view lab/test results, encounter notes, upcoming appointments, etc.  Non-urgent messages can be sent to your provider as well. To learn more about what you can do with MyChart, please visit --  [email or web address].

## 2024-04-18 NOTE — Assessment & Plan Note (Signed)
 Wife notes he may have issue with hearing and mentioned possible ENT referral. Patient denies any sudden hearing loss, no ear pain. Discussed options, we can proceed with initial audiology assessment and determine next steps from there

## 2024-04-18 NOTE — Assessment & Plan Note (Signed)

## 2024-04-18 NOTE — Assessment & Plan Note (Signed)
 Previously placed referral to sleep medicine, but patient ultimately did not schedule appointment. Still interested but notes difficulty with arranging around work schedule. Referral placed again today.

## 2024-04-19 LAB — LIPID PANEL
Chol/HDL Ratio: 2.1 ratio (ref 0.0–5.0)
Cholesterol, Total: 154 mg/dL (ref 100–199)
HDL: 75 mg/dL (ref >39)
LDL Chol Calc (NIH): 67 mg/dL (ref 0–99)
Triglycerides: 56 mg/dL (ref 0–149)
VLDL Cholesterol Cal: 12 mg/dL (ref 5–40)

## 2024-04-19 LAB — CBC WITH DIFFERENTIAL/PLATELET
Basophils Absolute: 0 x10E3/uL (ref 0.0–0.2)
Basos: 1 %
EOS (ABSOLUTE): 0.1 x10E3/uL (ref 0.0–0.4)
Eos: 2 %
Hematocrit: 41.5 % (ref 37.5–51.0)
Hemoglobin: 13.6 g/dL (ref 13.0–17.7)
Immature Grans (Abs): 0 x10E3/uL (ref 0.0–0.1)
Immature Granulocytes: 0 %
Lymphocytes Absolute: 1.5 x10E3/uL (ref 0.7–3.1)
Lymphs: 31 %
MCH: 31.6 pg (ref 26.6–33.0)
MCHC: 32.8 g/dL (ref 31.5–35.7)
MCV: 96 fL (ref 79–97)
Monocytes Absolute: 0.7 x10E3/uL (ref 0.1–0.9)
Monocytes: 14 %
Neutrophils Absolute: 2.5 x10E3/uL (ref 1.4–7.0)
Neutrophils: 52 %
Platelets: 278 x10E3/uL (ref 150–450)
RBC: 4.31 x10E6/uL (ref 4.14–5.80)
RDW: 14.1 % (ref 11.6–15.4)
WBC: 4.8 x10E3/uL (ref 3.4–10.8)

## 2024-04-19 LAB — COMPREHENSIVE METABOLIC PANEL WITH GFR
ALT: 21 IU/L (ref 0–44)
AST: 15 IU/L (ref 0–40)
Albumin: 4.3 g/dL (ref 4.1–5.1)
Alkaline Phosphatase: 66 IU/L (ref 44–121)
BUN/Creatinine Ratio: 12 (ref 9–20)
BUN: 10 mg/dL (ref 6–20)
Bilirubin Total: 0.3 mg/dL (ref 0.0–1.2)
CO2: 24 mmol/L (ref 20–29)
Calcium: 8.8 mg/dL (ref 8.7–10.2)
Chloride: 105 mmol/L (ref 96–106)
Creatinine, Ser: 0.84 mg/dL (ref 0.76–1.27)
Globulin, Total: 2.4 g/dL (ref 1.5–4.5)
Glucose: 84 mg/dL (ref 70–99)
Potassium: 4 mmol/L (ref 3.5–5.2)
Sodium: 139 mmol/L (ref 134–144)
Total Protein: 6.7 g/dL (ref 6.0–8.5)
eGFR: 117 mL/min/1.73 (ref >59)

## 2024-04-19 LAB — HEMOGLOBIN A1C
Est. average glucose Bld gHb Est-mCnc: 108 mg/dL
Hgb A1c MFr Bld: 5.4 % (ref 4.8–5.6)

## 2024-04-19 LAB — TSH RFX ON ABNORMAL TO FREE T4: TSH: 0.625 u[IU]/mL (ref 0.450–4.500)

## 2024-05-02 ENCOUNTER — Ambulatory Visit (HOSPITAL_BASED_OUTPATIENT_CLINIC_OR_DEPARTMENT_OTHER): Payer: Self-pay | Admitting: Family Medicine

## 2025-04-22 ENCOUNTER — Encounter (HOSPITAL_BASED_OUTPATIENT_CLINIC_OR_DEPARTMENT_OTHER): Admitting: Family Medicine
